# Patient Record
Sex: Male | Born: 1955
Health system: Southern US, Community
[De-identification: ages and names within clinical notes are randomized; demographics above are authoritative.]

## PROBLEM LIST (undated history)

## (undated) DIAGNOSIS — K219 Gastro-esophageal reflux disease without esophagitis: Secondary | ICD-10-CM

## (undated) DIAGNOSIS — I10 Essential (primary) hypertension: Secondary | ICD-10-CM

## (undated) DIAGNOSIS — M545 Low back pain, unspecified: Secondary | ICD-10-CM

## (undated) DIAGNOSIS — T7840XA Allergy, unspecified, initial encounter: Secondary | ICD-10-CM

## (undated) HISTORY — PX: COLONOSCOPY: SHX174

## (undated) HISTORY — DX: Low back pain, unspecified: M54.50

## (undated) HISTORY — DX: Gastro-esophageal reflux disease without esophagitis: K21.9

## (undated) HISTORY — DX: Low back pain: M54.5

## (undated) HISTORY — DX: Allergy, unspecified, initial encounter: T78.40XA

## (undated) HISTORY — DX: Essential (primary) hypertension: I10

---

## 1998-12-07 ENCOUNTER — Other Ambulatory Visit: Admission: RE | Admit: 1998-12-07 | Discharge: 1998-12-07 | Payer: Self-pay | Admitting: Obstetrics and Gynecology

## 2002-06-05 ENCOUNTER — Encounter: Payer: Self-pay | Admitting: Emergency Medicine

## 2002-06-05 ENCOUNTER — Emergency Department (HOSPITAL_COMMUNITY): Admission: EM | Admit: 2002-06-05 | Discharge: 2002-06-05 | Payer: Self-pay | Admitting: Emergency Medicine

## 2003-05-20 ENCOUNTER — Encounter: Payer: Self-pay | Admitting: Emergency Medicine

## 2003-05-20 ENCOUNTER — Emergency Department (HOSPITAL_COMMUNITY): Admission: EM | Admit: 2003-05-20 | Discharge: 2003-05-20 | Payer: Self-pay | Admitting: Emergency Medicine

## 2006-07-07 ENCOUNTER — Ambulatory Visit: Payer: Self-pay | Admitting: Family Medicine

## 2006-12-08 ENCOUNTER — Ambulatory Visit: Payer: Self-pay | Admitting: Family Medicine

## 2007-01-08 ENCOUNTER — Ambulatory Visit: Payer: Self-pay | Admitting: Family Medicine

## 2007-01-08 LAB — CONVERTED CEMR LAB
BUN: 12 mg/dL (ref 6–23)
CO2: 28 meq/L (ref 19–32)
Calcium: 9.2 mg/dL (ref 8.4–10.5)
Chloride: 105 meq/L (ref 96–112)
Cholesterol: 183 mg/dL (ref 0–200)
Creatinine, Ser: 0.9 mg/dL (ref 0.4–1.5)
GFR calc Af Amer: 115 mL/min
GFR calc non Af Amer: 95 mL/min
Glucose, Bld: 95 mg/dL (ref 70–99)
HDL: 49.2 mg/dL (ref >39.0)
LDL Cholesterol: 117 mg/dL — ABNORMAL HIGH (ref 0–99)
Potassium: 3.8 meq/L (ref 3.5–5.1)
Sodium: 141 meq/L (ref 135–145)
Total CHOL/HDL Ratio: 3.7
Triglycerides: 83 mg/dL (ref 0–149)
VLDL: 17 mg/dL (ref 0–40)

## 2007-02-09 ENCOUNTER — Ambulatory Visit: Payer: Self-pay | Admitting: Internal Medicine

## 2007-09-21 ENCOUNTER — Telehealth (INDEPENDENT_AMBULATORY_CARE_PROVIDER_SITE_OTHER): Payer: Self-pay | Admitting: *Deleted

## 2007-09-30 ENCOUNTER — Ambulatory Visit: Payer: Self-pay | Admitting: Family Medicine

## 2007-09-30 DIAGNOSIS — I10 Essential (primary) hypertension: Secondary | ICD-10-CM | POA: Insufficient documentation

## 2007-10-01 ENCOUNTER — Telehealth (INDEPENDENT_AMBULATORY_CARE_PROVIDER_SITE_OTHER): Payer: Self-pay | Admitting: *Deleted

## 2007-10-06 ENCOUNTER — Encounter (INDEPENDENT_AMBULATORY_CARE_PROVIDER_SITE_OTHER): Payer: Self-pay | Admitting: Family Medicine

## 2007-10-28 ENCOUNTER — Ambulatory Visit: Payer: Self-pay | Admitting: Family Medicine

## 2007-10-29 LAB — CONVERTED CEMR LAB
BUN: 9 mg/dL (ref 6–23)
CO2: 29 meq/L (ref 19–32)
Calcium: 9.7 mg/dL (ref 8.4–10.5)
Chloride: 106 meq/L (ref 96–112)
Cholesterol: 191 mg/dL (ref 0–200)
Creatinine, Ser: 1 mg/dL (ref 0.4–1.5)
GFR calc Af Amer: 102 mL/min
GFR calc non Af Amer: 84 mL/min
Glucose, Bld: 89 mg/dL (ref 70–99)
HDL: 48.5 mg/dL (ref >39.0)
LDL Cholesterol: 127 mg/dL — ABNORMAL HIGH (ref 0–99)
PSA: 1.02 ng/mL (ref 0.10–4.00)
Potassium: 3.8 meq/L (ref 3.5–5.1)
Sodium: 141 meq/L (ref 135–145)
Total CHOL/HDL Ratio: 3.9
Triglycerides: 80 mg/dL (ref 0–149)
VLDL: 16 mg/dL (ref 0–40)

## 2007-10-30 ENCOUNTER — Telehealth (INDEPENDENT_AMBULATORY_CARE_PROVIDER_SITE_OTHER): Payer: Self-pay | Admitting: *Deleted

## 2007-10-30 ENCOUNTER — Encounter (INDEPENDENT_AMBULATORY_CARE_PROVIDER_SITE_OTHER): Payer: Self-pay | Admitting: *Deleted

## 2007-12-17 ENCOUNTER — Ambulatory Visit: Payer: Self-pay | Admitting: Family Medicine

## 2008-07-01 ENCOUNTER — Telehealth (INDEPENDENT_AMBULATORY_CARE_PROVIDER_SITE_OTHER): Payer: Self-pay | Admitting: *Deleted

## 2008-07-25 ENCOUNTER — Ambulatory Visit: Payer: Self-pay | Admitting: Internal Medicine

## 2008-07-26 ENCOUNTER — Encounter (INDEPENDENT_AMBULATORY_CARE_PROVIDER_SITE_OTHER): Payer: Self-pay | Admitting: *Deleted

## 2008-08-10 ENCOUNTER — Ambulatory Visit: Payer: Self-pay | Admitting: Gastroenterology

## 2008-08-24 ENCOUNTER — Ambulatory Visit: Payer: Self-pay | Admitting: Gastroenterology

## 2008-08-24 LAB — HM COLONOSCOPY

## 2008-08-26 ENCOUNTER — Telehealth: Payer: Self-pay | Admitting: Gastroenterology

## 2009-02-18 ENCOUNTER — Ambulatory Visit: Payer: Self-pay | Admitting: *Deleted

## 2009-02-18 DIAGNOSIS — J309 Allergic rhinitis, unspecified: Secondary | ICD-10-CM | POA: Insufficient documentation

## 2009-03-15 ENCOUNTER — Ambulatory Visit: Payer: Self-pay | Admitting: Internal Medicine

## 2009-03-21 ENCOUNTER — Ambulatory Visit: Payer: Self-pay | Admitting: Internal Medicine

## 2009-03-27 LAB — CONVERTED CEMR LAB
ALT: 24 units/L (ref 0–53)
AST: 20 units/L (ref 0–37)
BUN: 13 mg/dL (ref 6–23)
Basophils Absolute: 0 10*3/uL (ref 0.0–0.1)
Basophils Relative: 0.1 % (ref 0.0–3.0)
CO2: 30 meq/L (ref 19–32)
Calcium: 9.2 mg/dL (ref 8.4–10.5)
Chloride: 107 meq/L (ref 96–112)
Cholesterol: 184 mg/dL (ref 0–200)
Creatinine, Ser: 0.9 mg/dL (ref 0.4–1.5)
Eosinophils Absolute: 0.1 10*3/uL (ref 0.0–0.7)
Eosinophils Relative: 3.1 % (ref 0.0–5.0)
GFR calc non Af Amer: 94.47 mL/min (ref >60)
Glucose, Bld: 95 mg/dL (ref 70–99)
HCT: 45.8 % (ref 39.0–52.0)
HDL: 40.5 mg/dL (ref >39.00)
Hemoglobin: 15.9 g/dL (ref 13.0–17.0)
LDL Cholesterol: 127 mg/dL — ABNORMAL HIGH (ref 0–99)
Lymphocytes Relative: 32.7 % (ref 12.0–46.0)
Lymphs Abs: 1.5 10*3/uL (ref 0.7–4.0)
MCHC: 34.7 g/dL (ref 30.0–36.0)
MCV: 90.9 fL (ref 78.0–100.0)
Monocytes Absolute: 0.4 10*3/uL (ref 0.1–1.0)
Monocytes Relative: 8.2 % (ref 3.0–12.0)
Neutro Abs: 2.6 10*3/uL (ref 1.4–7.7)
Neutrophils Relative %: 55.9 % (ref 43.0–77.0)
PSA: 1.11 ng/mL (ref 0.10–4.00)
Platelets: 180 10*3/uL (ref 150.0–400.0)
Potassium: 4.2 meq/L (ref 3.5–5.1)
RBC: 5.03 M/uL (ref 4.22–5.81)
RDW: 11.6 % (ref 11.5–14.6)
Sodium: 142 meq/L (ref 135–145)
TSH: 1.33 microintl units/mL (ref 0.35–5.50)
Total CHOL/HDL Ratio: 5
Triglycerides: 84 mg/dL (ref 0.0–149.0)
VLDL: 16.8 mg/dL (ref 0.0–40.0)
WBC: 4.6 10*3/uL (ref 4.5–10.5)

## 2010-03-12 ENCOUNTER — Encounter (INDEPENDENT_AMBULATORY_CARE_PROVIDER_SITE_OTHER): Payer: Self-pay | Admitting: *Deleted

## 2010-03-21 ENCOUNTER — Ambulatory Visit: Payer: Self-pay | Admitting: Internal Medicine

## 2010-03-27 LAB — CONVERTED CEMR LAB
ALT: 22 units/L (ref 0–53)
AST: 19 units/L (ref 0–37)
BUN: 15 mg/dL (ref 6–23)
Basophils Absolute: 0 10*3/uL (ref 0.0–0.1)
Basophils Relative: 0.4 % (ref 0.0–3.0)
CO2: 31 meq/L (ref 19–32)
Calcium: 9.4 mg/dL (ref 8.4–10.5)
Chloride: 103 meq/L (ref 96–112)
Cholesterol: 195 mg/dL (ref 0–200)
Creatinine, Ser: 1 mg/dL (ref 0.4–1.5)
Eosinophils Absolute: 0.1 10*3/uL (ref 0.0–0.7)
Eosinophils Relative: 2.3 % (ref 0.0–5.0)
GFR calc non Af Amer: 83.32 mL/min (ref >60)
Glucose, Bld: 102 mg/dL — ABNORMAL HIGH (ref 70–99)
HCT: 46.2 % (ref 39.0–52.0)
HDL: 50.8 mg/dL (ref >39.00)
Hemoglobin: 16 g/dL (ref 13.0–17.0)
LDL Cholesterol: 131 mg/dL — ABNORMAL HIGH (ref 0–99)
Lymphocytes Relative: 28.7 % (ref 12.0–46.0)
Lymphs Abs: 1.5 10*3/uL (ref 0.7–4.0)
MCHC: 34.7 g/dL (ref 30.0–36.0)
MCV: 89.7 fL (ref 78.0–100.0)
Monocytes Absolute: 0.3 10*3/uL (ref 0.1–1.0)
Monocytes Relative: 6.3 % (ref 3.0–12.0)
Neutro Abs: 3.2 10*3/uL (ref 1.4–7.7)
Neutrophils Relative %: 62.3 % (ref 43.0–77.0)
PSA: 1.17 ng/mL (ref 0.10–4.00)
Platelets: 208 10*3/uL (ref 150.0–400.0)
Potassium: 4.2 meq/L (ref 3.5–5.1)
RBC: 5.15 M/uL (ref 4.22–5.81)
RDW: 12.7 % (ref 11.5–14.6)
Sodium: 141 meq/L (ref 135–145)
TSH: 2.55 microintl units/mL (ref 0.35–5.50)
Total CHOL/HDL Ratio: 4
Triglycerides: 64 mg/dL (ref 0.0–149.0)
VLDL: 12.8 mg/dL (ref 0.0–40.0)
WBC: 5.1 10*3/uL (ref 4.5–10.5)

## 2010-12-03 ENCOUNTER — Emergency Department (HOSPITAL_COMMUNITY)
Admission: EM | Admit: 2010-12-03 | Discharge: 2010-12-03 | Payer: Self-pay | Source: Home / Self Care | Admitting: Emergency Medicine

## 2010-12-05 ENCOUNTER — Ambulatory Visit
Admission: RE | Admit: 2010-12-05 | Discharge: 2010-12-05 | Payer: Self-pay | Source: Home / Self Care | Attending: Internal Medicine | Admitting: Internal Medicine

## 2010-12-05 DIAGNOSIS — R1013 Epigastric pain: Secondary | ICD-10-CM | POA: Insufficient documentation

## 2010-12-10 LAB — COMPREHENSIVE METABOLIC PANEL
Albumin: 4.2 g/dL (ref 3.5–5.2)
BUN: 12 mg/dL (ref 6–23)
CO2: 27 mEq/L (ref 19–32)
Calcium: 9 mg/dL (ref 8.4–10.5)
Chloride: 106 mEq/L (ref 96–112)
Creatinine, Ser: 1.02 mg/dL (ref 0.4–1.5)
GFR calc Af Amer: >60 mL/min (ref >60)
GFR calc non Af Amer: >60 mL/min (ref >60)
Glucose, Bld: 121 mg/dL — ABNORMAL HIGH (ref 70–99)
Potassium: 3.8 mEq/L (ref 3.5–5.1)
Sodium: 140 mEq/L (ref 135–145)
Total Bilirubin: 0.9 mg/dL (ref 0.3–1.2)

## 2010-12-10 LAB — DIFFERENTIAL
Basophils Absolute: 0 10*3/uL (ref 0.0–0.1)
Basophils Relative: 0 % (ref 0–1)
Eosinophils Absolute: 0.1 10*3/uL (ref 0.0–0.7)
Eosinophils Relative: 1 % (ref 0–5)
Lymphs Abs: 0.9 10*3/uL (ref 0.7–4.0)
Monocytes Absolute: 0.3 10*3/uL (ref 0.1–1.0)
Monocytes Relative: 7 % (ref 3–12)
Neutro Abs: 3 10*3/uL (ref 1.7–7.7)
Neutrophils Relative %: 70 % (ref 43–77)

## 2010-12-10 LAB — CBC
HCT: 44.2 % (ref 39.0–52.0)
Hemoglobin: 14.8 g/dL (ref 13.0–17.0)
MCH: 29.4 pg (ref 26.0–34.0)
MCHC: 33.5 g/dL (ref 30.0–36.0)
MCV: 87.9 fL (ref 78.0–100.0)
Platelets: 175 10*3/uL (ref 150–400)
RBC: 5.03 MIL/uL (ref 4.22–5.81)
RDW: 12.5 % (ref 11.5–15.5)
WBC: 4.3 10*3/uL (ref 4.0–10.5)

## 2010-12-10 LAB — LIPASE, BLOOD: Lipase: 29 U/L (ref 11–59)

## 2010-12-10 LAB — POCT CARDIAC MARKERS
CKMB, poc: <1 ng/mL — ABNORMAL LOW (ref 1.0–8.0)
Myoglobin, poc: 71 ng/mL (ref 12–200)
Troponin i, poc: <0.05 ng/mL (ref 0.00–0.09)

## 2010-12-25 NOTE — Assessment & Plan Note (Signed)
Summary: cpx/kdc   Vital Signs:  Patient profile:   55 year old male Height:      71 inches Weight:      210.6 pounds BMI:     29.48 Pulse rate:   68 / minute BP sitting:   130 / 84  Vitals Entered By: Shary Decamp (March 21, 2010 9:31 AM) CC: cpx - fasting Is Patient Diabetic? No   History of Present Illness: CPX doing well  Preventive Screening-Counseling & Management  Alcohol-Tobacco     Alcohol type: wine  Caffeine-Diet-Exercise     Caffeine use/day: 2     Times/week: 2  Current Medications (verified): 1)  Norvasc 10 Mg  Tabs (Amlodipine Besylate) .... Take One Tablet Daily - Needs Office Visit For Additional Refills 2)  Flonase 50 Mcg/act  Susp (Fluticasone Propionate) .... 2 Sprays Each Nostril Qhs  Allergies (verified): No Known Drug Allergies  Past History:  Past Medical History: Reviewed history from 02/18/2009 and no changes required. Hypertension Allergic rhinitis intermittent low back pain  Past Surgical History: Reviewed history from 07/25/2008 and no changes required. no  Family History: Reviewed history from 07/25/2008 and no changes required. 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 CV:  Denies chest pain or discomfort; occasionally LE edema . Resp:  Denies cough and shortness of breath. GI:  Denies bloody stools, diarrhea, nausea, and vomiting. GU:  Denies hematuria, urinary frequency, and urinary hesitancy.  Physical Exam  General:  alert, well-developed, and well-nourished.   Neck:  no masses and no thyromegaly.   Lungs:  normal respiratory effort, no intercostal retractions, no accessory muscle use, and normal breath sounds.   Heart:  normal rate, regular rhythm, and no murmur.     Abdomen:  soft, non-tender, no distention, no masses, no guarding, and no rigidity.  no bruit Rectal:  small external hemorrhoids noted. Normal sphincter tone. No rectal masses or tenderness. Prostate:  Prostate gland firm and smooth, no enlargement, nodularity, tenderness, mass, asymmetry or induration. Extremities:  trace bilateral lower extremity edema Psych:  Cognition and judgment appear intact. Alert and cooperative with normal attention span and concentration. not anxious appearing and not depressed appearing.     Impression & Recommendations:  Problem # 1:  PREVENTIVE HEALTH CARE (ICD-V70.0)  Td 12- 2008 Cscope 9-09 neg except for hemorhhoids   doing very well encouraged to cont. his healthy lifestyle     Orders: Venipuncture (13086) TLB-BMP (Basic Metabolic Panel-BMET) (80048-METABOL) TLB-CBC Platelet - w/Differential (85025-CBCD) TLB-ALT (SGPT) (84460-ALT) TLB-AST (SGOT) (84450-SGOT) TLB-Lipid Panel (80061-LIPID) TLB-PSA (Prostate Specific Antigen) (84153-PSA) TLB-TSH (Thyroid Stimulating Hormone) (84443-TSH)  Problem # 2:  HYPERTENSION (ICD-401.9) BP well controlled, trace bilateral lower shunt edema likely from Norvasc.  Not a major problem for now.  Advised patient to let me know if this  gets worse His updated medication list for this problem includes:    Norvasc 10 Mg Tabs (Amlodipine besylate) .Marland Kitchen... Take one tablet daily  BP today: 130/84 Prior BP: 150/90 (03/15/2009)  Labs Reviewed: K+: 4.2 (03/21/2009) Creat: : 0.9 (03/21/2009)   Chol: 184 (03/21/2009)   HDL: 40.50 (03/21/2009)   LDL: 127 (03/21/2009)   TG: 84.0 (03/21/2009)  Complete Medication List: 1)  Norvasc 10 Mg Tabs (Amlodipine besylate) .... Take one tablet daily 2)  Flonase 50  Mcg/act Susp (Fluticasone propionate) .... 2 sprays each nostril qhs  Patient Instructions: 1)  Check your blood pressure 2   times a month. If it is more than 140/85 consistently,please let us know  2)  Please  schedule a follow-up appointment in 1 year.  Prescriptions: NORVASC 10 MG  TABS (AMLODIPINE BESYLATE) Take one tablet daily - NEEDS OFFICE VISIT FOR ADDITIONAL REFILLS  #30 x 6   Entered by:   Shary Decamp   Authorized by:   Nolon Rod. Keirra Zeimet MD   Signed by:   Shary Decamp on 03/21/2010   Method used:   Electronically to        [facility] [id number] (retail)       [address]       [place], [place]  [address]       Ph: [phone]       Fax: [phone]   RxID:   [id number]      Preventive Care Screening  Prior Values:    PSA:  1.11 (03/21/2009)    Colonoscopy:  Location:  [facility].   (08/24/2008)    Last Tetanus Booster:  Tdap (10/28/2007)     Risk Factors:  Tobacco use:  never Caffeine use:  2 drinks per day Alcohol use:  yes    Type:  wine    Drinks per day:  1 Exercise:  yes    Times per week:  2    Type:  martial arts-bike   Colonoscopy History:    Date of Last Colonoscopy:  08/24/2008

## 2010-12-25 NOTE — Letter (Signed)
Summary: Primary Care Appointment Letter  [facility]  [address] [place], [place] [address]   Phone: [phone]  Fax: [phone]    03/12/2010 MRN: [id number]  OKLEY MAGNUSSEN [address] [place], [place]  [address]  Dear Mr. Hellinger,   Your Primary Care Physician Centerton E. Paz MD has indicated that:    ____X___it is time to schedule an appointment.  Please call our office @ [phone] to schedule an office visit with Dr. Drue Novel.   Thank you,    Deaf Smith Primary Care Scheduler

## 2010-12-27 NOTE — Assessment & Plan Note (Signed)
Summary: ER f/u on dx of GERD//lch   Vital Signs:  Patient profile:   55 year old male Weight:      215.13 pounds Pulse rate:   80 / minute Pulse rhythm:   regular BP sitting:   138 / 90  (left arm) Cuff size:   large  Vitals Entered By: Army Fossa CMA (December 05, 2010 2:56 PM)  History of Present Illness: the patient woke up with an acute, sharp, epigastric pain. Pain was moderate to severe No radiation Went to the ER, symptoms responded quickly to a GI cocktail. His upper abdomen was slightly distended.  ER records from 1- 9- 12 abdominal x-rays and EKG normal BMP, CBC, LFTs, lipase normal Cardiac enzymes x1 normal  ROS Since the ER visit, he is doing better, he is taking Carafate and Protonix. Abdominal pain essentially has subsided. Denies nausea, vomiting, diarrhea No classic heartburn or odynophagia No substernal chest pain Bowel movements are regular (slightly more frequent lately)  Current Medications (verified): 1)  Norvasc 10 Mg  Tabs (Amlodipine Besylate) .... Take One Tablet Daily 2)  Flonase 50 Mcg/act  Susp (Fluticasone Propionate) .... 2 Sprays Each Nostril Qhs 3)  Carafate 1 Gm Tabs (Sucralfate) .Marland Kitchen.. 1 By Mouth Qid Ac and At Bedtime. 4)  Protonix 40 Mg Solr (Pantoprazole Sodium) .Marland Kitchen.. 1 By Mouth Daily.  Allergies (verified): No Known Drug Allergies  Past History:  Past Medical History: Reviewed history from 02/18/2009 and no changes required. Hypertension Allergic rhinitis intermittent low back pain  Past Surgical History: Reviewed history from 07/25/2008 and no changes required. no  Social History: Reviewed history from 03/21/2010 and no changes required. 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   Lungs:  normal respiratory effort, no intercostal retractions, no  accessory muscle use, and normal breath sounds.   Heart:  normal rate, regular rhythm, and no murmur.   Abdomen:  soft, non-tender, no distention, no masses, no guarding, and no rigidity.     Impression & Recommendations:  Problem # 1:  EPIGASTRIC PAIN (ICD-789.06) acute epigastric pain, ER workup negative. pain is getting better with Protonix and Carafate. DDX includes gastritis, PUD,  GERD, Gallbladder  Plan: Discontinue Carafate Continue Protonix for 6 weeks Further evaluation if the pain resurface  Complete Medication List: 1)  Norvasc 10 Mg Tabs (Amlodipine besylate) .... Take one tablet daily 2)  Flonase 50 Mcg/act Susp (Fluticasone propionate) .... 2 sprays each nostril qhs 3)  Protonix 40 Mg Solr (Pantoprazole sodium) .Marland Kitchen.. 1 by mouth daily.  Patient Instructions: 1)  stop carafate 2)   protonix  1 a day x 6 weeks, then stop 3)  call id symptoms severe or resurface Prescriptions: PROTONIX 40 MG SOLR (PANTOPRAZOLE SODIUM) 1 by mouth daily.  #30 x 1   Entered and Authorized by:   Nolon Rod. Paz MD   Signed by:   Nolon Rod. Paz MD on 12/05/2010   Method used:   Print then Give to Patient   RxID:   [id number]    Orders Added: 1)  Est. Patient Level III [14782]

## 2011-01-17 ENCOUNTER — Other Ambulatory Visit: Payer: Self-pay | Admitting: Internal Medicine

## 2011-01-17 ENCOUNTER — Encounter (INDEPENDENT_AMBULATORY_CARE_PROVIDER_SITE_OTHER): Payer: 59 | Admitting: Internal Medicine

## 2011-01-17 ENCOUNTER — Encounter: Payer: Self-pay | Admitting: Internal Medicine

## 2011-01-17 DIAGNOSIS — Z0000 Encounter for general adult medical examination without abnormal findings: Secondary | ICD-10-CM

## 2011-01-17 LAB — CBC WITH DIFFERENTIAL/PLATELET
Basophils Absolute: 0 10*3/uL (ref 0.0–0.1)
Basophils Relative: 0.4 % (ref 0.0–3.0)
Eosinophils Absolute: 0.1 10*3/uL (ref 0.0–0.7)
Eosinophils Relative: 1.8 % (ref 0.0–5.0)
HCT: 45.7 % (ref 39.0–52.0)
Hemoglobin: 15.7 g/dL (ref 13.0–17.0)
Lymphocytes Relative: 30.2 % (ref 12.0–46.0)
Lymphs Abs: 1.5 10*3/uL (ref 0.7–4.0)
MCHC: 34.4 g/dL (ref 30.0–36.0)
MCV: 91.3 fl (ref 78.0–100.0)
Monocytes Absolute: 0.4 10*3/uL (ref 0.1–1.0)
Monocytes Relative: 7.7 % (ref 3.0–12.0)
Neutro Abs: 2.9 10*3/uL (ref 1.4–7.7)
Neutrophils Relative %: 59.9 % (ref 43.0–77.0)
Platelets: 204 10*3/uL (ref 150.0–400.0)
RBC: 5 Mil/uL (ref 4.22–5.81)
RDW: 12.8 % (ref 11.5–14.6)
WBC: 4.9 10*3/uL (ref 4.5–10.5)

## 2011-01-17 LAB — BASIC METABOLIC PANEL
BUN: 15 mg/dL (ref 6–23)
CO2: 28 mEq/L (ref 19–32)
Calcium: 9.3 mg/dL (ref 8.4–10.5)
Chloride: 108 mEq/L (ref 96–112)
Creatinine, Ser: 0.9 mg/dL (ref 0.4–1.5)
GFR: 89.21 mL/min (ref >60.00)
Glucose, Bld: 100 mg/dL — ABNORMAL HIGH (ref 70–99)
Potassium: 4.1 mEq/L (ref 3.5–5.1)
Sodium: 140 mEq/L (ref 135–145)

## 2011-01-17 LAB — LIPID PANEL
Cholesterol: 189 mg/dL (ref 0–200)
HDL: 41.3 mg/dL (ref >39.00)
LDL Cholesterol: 129 mg/dL — ABNORMAL HIGH (ref 0–99)
Total CHOL/HDL Ratio: 5
Triglycerides: 93 mg/dL (ref 0.0–149.0)
VLDL: 18.6 mg/dL (ref 0.0–40.0)

## 2011-01-17 LAB — HEPATIC FUNCTION PANEL
ALT: 20 U/L (ref 0–53)
AST: 19 U/L (ref 0–37)
Albumin: 4.4 g/dL (ref 3.5–5.2)
Alkaline Phosphatase: 56 U/L (ref 39–117)
Bilirubin, Direct: 0.2 mg/dL (ref 0.0–0.3)
Total Bilirubin: 1.5 mg/dL — ABNORMAL HIGH (ref 0.3–1.2)
Total Protein: 7.2 g/dL (ref 6.0–8.3)

## 2011-01-17 LAB — PSA: PSA: 1.01 ng/mL (ref 0.10–4.00)

## 2011-01-22 NOTE — Assessment & Plan Note (Signed)
Summary: CPX AND FASTING LABS/SPH/PH   Vital Signs:  Patient profile:   55 year old male Height:      71 inches Weight:      212.25 pounds BMI:     29.71 Pulse rate:   88 / minute Pulse rhythm:   regular BP sitting:   142 / 90  (left arm) Cuff size:   large  Vitals Entered By: Army Fossa CMA (January 17, 2011 7:58 AM) CC: CPX, fasting Comments W/ pts BP machine- 162/96 [facility]   History of Present Illness: CPX ambulatory BPs 120-140s/80s, his machine does not seem  to be calibrate  Preventive Screening-Counseling & Management  Caffeine-Diet-Exercise     Type of exercise: walking 1 1/2 miles      Times/week: 7  Current Medications (verified): 1)  Norvasc 10 Mg  Tabs (Amlodipine Besylate) .... Take One Tablet Daily 2)  Flonase 50 Mcg/act  Susp (Fluticasone Propionate) .... 2 Sprays Each Nostril Qhs 3)  Protonix 40 Mg Solr (Pantoprazole Sodium) .Marland Kitchen.. 1 By Mouth Daily.  Allergies (verified): No Known Drug Allergies  Past History:  Past Medical History: Reviewed history from 02/18/2009 and no changes required. Hypertension Allergic rhinitis intermittent low back pain  Past Surgical History: Reviewed history from 07/25/2008 and no changes required. 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 CV:  Denies chest pain or discomfort and swelling of feet. Resp:  Denies cough and shortness of breath. GI:  Denies bloody stools and nausea; no GERD, symptoms well controlled w/ PPIs. GU:  Denies hematuria, urinary frequency, and urinary hesitancy.  Physical Exam  General:  alert, well-developed, and well-nourished.   Neck:  no  masses and no thyromegaly.   Lungs:  normal respiratory effort, no intercostal retractions, no accessory muscle use, and normal breath sounds.   Heart:  normal rate, regular rhythm, and no murmur.   Abdomen:  soft, non-tender, no distention, no masses, no guarding, and no rigidity.   Rectal:  small external hemorrhoids noted. Normal sphincter tone. No rectal masses or tenderness. Prostate:  Prostate gland firm and smooth, no enlargement, nodularity, tenderness, mass, asymmetry or induration. Extremities:  no pretibial edema bilaterally  Psych:  Cognition and judgment appear intact. Alert and cooperative with normal attention span and concentration. No apparent delusions, illusions, hallucinations   Impression & Recommendations:  Problem # 1:  PREVENTIVE HEALTH CARE (ICD-V70.0) Td 12- 2008  Cscope 9-09 neg except for hemorhhoids   encouraged to cont. his healthy lifestyle  pt  will need a copy of his  labs  Orders: Venipuncture (19147) TLB-BMP (Basic Metabolic Panel-BMET) (80048-METABOL) TLB-CBC Platelet - w/Differential (85025-CBCD) TLB-Lipid Panel (80061-LIPID) TLB-Hepatic/Liver Function Pnl (80076-HEPATIC) TLB-PSA (Prostate Specific Antigen) (84153-PSA) Specimen Handling (82956)  Problem # 2:  HYPERTENSION (ICD-401.9) BP at goal? will ask patient to get a calibrated machine, see instructions  His updated medication list for this problem includes:    Norvasc 10 Mg Tabs (Amlodipine besylate) .Marland Kitchen... Take one tablet daily  BP today: 142/90 Prior BP: 138/90 (12/05/2010)  Labs Reviewed: K+: 4.2 (03/21/2010) Creat: : 1.0 (03/21/2010)   Chol: 195 (03/21/2010)   HDL: 50.80 (03/21/2010)   LDL: 131 (03/21/2010)   TG: 64.0 (  03/21/2010)  Complete Medication List: 1)  Norvasc 10 Mg Tabs (Amlodipine besylate) .... Take one tablet daily 2)  Flonase 50 Mcg/act Susp (Fluticasone propionate) .... 2 sprays each nostril qhs 3)  Protonix 40 Mg Solr (Pantoprazole sodium) .Marland Kitchen.. 1 by mouth  daily.  Patient Instructions: 1)  Check your blood pressure 2 or 3 times a week. If it is more than 140/85 consistently,please let us know  2)  Please schedule a follow-up appointment in 6 months .    Orders Added: 1)  Venipuncture [36415] 2)  TLB-BMP (Basic Metabolic Panel-BMET) [80048-METABOL] 3)  TLB-CBC Platelet - w/Differential [85025-CBCD] 4)  TLB-Lipid Panel [80061-LIPID] 5)  TLB-Hepatic/Liver Function Pnl [80076-HEPATIC] 6)  TLB-PSA (Prostate Specific Antigen) [84153-PSA] 7)  Specimen Handling [99000] 8)  Est. Patient age 41-64 [89]     Risk Factors:  Exercise:  yes    Times per week:  7    Type:  walking 1 1/2 miles

## 2011-02-21 ENCOUNTER — Other Ambulatory Visit: Payer: Self-pay | Admitting: Internal Medicine

## 2011-04-12 ENCOUNTER — Other Ambulatory Visit: Payer: Self-pay | Admitting: Internal Medicine

## 2011-04-13 ENCOUNTER — Other Ambulatory Visit: Payer: Self-pay | Admitting: Internal Medicine

## 2011-05-13 ENCOUNTER — Ambulatory Visit (INDEPENDENT_AMBULATORY_CARE_PROVIDER_SITE_OTHER): Payer: 59 | Admitting: Internal Medicine

## 2011-05-13 ENCOUNTER — Encounter: Payer: Self-pay | Admitting: Internal Medicine

## 2011-05-13 VITALS — BP 148/82 | HR 90 | Wt 212.4 lb

## 2011-05-13 DIAGNOSIS — I10 Essential (primary) hypertension: Secondary | ICD-10-CM

## 2011-05-13 DIAGNOSIS — R739 Hyperglycemia, unspecified: Secondary | ICD-10-CM | POA: Insufficient documentation

## 2011-05-13 DIAGNOSIS — R7309 Other abnormal glucose: Secondary | ICD-10-CM

## 2011-05-13 NOTE — Assessment & Plan Note (Signed)
BP well controlled most of the time See instructions

## 2011-05-13 NOTE — Patient Instructions (Signed)
Check the  blood pressure 2 or 3 times a week, be sure it is less than 140/85. If it is consistently higher, let me know  

## 2011-05-13 NOTE — Progress Notes (Signed)
  Subjective:    Patient ID: Jacob Gould, male    DOB: Dec 01, 1955, 55 y.o.   MRN: [id number]  HPI Here for BP check. Ambulatory blood pressures are mostly within normal, usually 120-130/70-80, from time to time it goes higher to > 140 is usually late in the afternoon (patient takes amlodipine early in the morning at 5 AM)  Past Medical History  Diagnosis Date  . Hypertension   . Allergic rhinitis   . Low back pain     intermittent   No past surgical history on file.   Review of Systems Feeling well, no chest pain, shortness of breath or lower extremity edema. Diet is healthy. He continued to be very active, walks his dog, exercise frequently and ride his bike.    Objective:   Physical Exam  Constitutional: He is oriented to person, place, and time. He appears well-developed.  Cardiovascular: Normal rate, regular rhythm and normal heart sounds.   No murmur heard. Pulmonary/Chest: Effort normal and breath sounds normal. No respiratory distress. He has no wheezes. He has no rales.  Musculoskeletal: He exhibits no edema.  Neurological: He is alert and oriented to person, place, and time.  Psychiatric: He has a normal mood and affect. His behavior is normal. Judgment and thought content normal.          Assessment & Plan:

## 2011-05-13 NOTE — Assessment & Plan Note (Addendum)
Mildly elevated CBG, will check a A1C, discussed with the patient the meaning of A1c

## 2011-05-14 LAB — HEMOGLOBIN A1C: Hgb A1c MFr Bld: 5.7 % (ref 4.6–6.5)

## 2011-07-11 ENCOUNTER — Other Ambulatory Visit: Payer: Self-pay | Admitting: Internal Medicine

## 2011-07-11 NOTE — Telephone Encounter (Signed)
Rx Done . 

## 2011-07-13 ENCOUNTER — Other Ambulatory Visit: Payer: Self-pay | Admitting: Internal Medicine

## 2011-07-15 NOTE — Telephone Encounter (Signed)
Rx Done . 

## 2011-07-18 ENCOUNTER — Ambulatory Visit: Payer: 59 | Admitting: Internal Medicine

## 2011-09-09 ENCOUNTER — Other Ambulatory Visit: Payer: Self-pay | Admitting: Internal Medicine

## 2011-09-09 NOTE — Telephone Encounter (Signed)
Done

## 2011-09-13 ENCOUNTER — Other Ambulatory Visit: Payer: Self-pay | Admitting: Internal Medicine

## 2011-09-13 NOTE — Telephone Encounter (Signed)
Done

## 2011-11-08 ENCOUNTER — Other Ambulatory Visit: Payer: Self-pay | Admitting: Internal Medicine

## 2011-12-07 ENCOUNTER — Other Ambulatory Visit: Payer: Self-pay | Admitting: Internal Medicine

## 2012-01-07 ENCOUNTER — Other Ambulatory Visit: Payer: Self-pay | Admitting: Internal Medicine

## 2012-01-07 NOTE — Telephone Encounter (Signed)
Refill done.  

## 2012-01-21 ENCOUNTER — Ambulatory Visit (INDEPENDENT_AMBULATORY_CARE_PROVIDER_SITE_OTHER): Payer: 59 | Admitting: Internal Medicine

## 2012-01-21 ENCOUNTER — Encounter: Payer: Self-pay | Admitting: Internal Medicine

## 2012-01-21 DIAGNOSIS — R739 Hyperglycemia, unspecified: Secondary | ICD-10-CM

## 2012-01-21 DIAGNOSIS — I10 Essential (primary) hypertension: Secondary | ICD-10-CM

## 2012-01-21 DIAGNOSIS — Z0000 Encounter for general adult medical examination without abnormal findings: Secondary | ICD-10-CM | POA: Insufficient documentation

## 2012-01-21 MED ORDER — AMLODIPINE BESYLATE 10 MG PO TABS: 10.0000 mg | ORAL_TABLET | Freq: Every day | ORAL | Status: DC

## 2012-01-21 NOTE — Progress Notes (Signed)
  Subjective:    Patient ID: Jacob Gould, male    DOB: Apr 20, 1956, 56 y.o.   MRN: [id number]  HPI CPX Patient is doing great, his diet has significantly improved, doing low salt, low fat. Has no more lower extremity edema. Ambulatory BPs are checked frequently and they are consistently less than 120/70. Good medication compliance.  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  Respiratory: Negative for cough and shortness of breath.   Cardiovascular: Negative for chest pain and leg swelling.  Gastrointestinal: Negative for abdominal pain and blood in stool.  Genitourinary: Negative for dysuria and hematuria.       Objective:   Physical Exam  General:  alert, well-developed, and well-nourished.   Neck:  no masses and no thyromegaly.   Lungs:  normal respiratory effort, no intercostal retractions, no accessory muscle use, and normal breath sounds.   Heart:  normal rate, regular rhythm, and no murmur.   Abdomen:  soft, non-tender, no distention, no masses, no guarding, and no rigidity. No bruit  Rectal:  small external hemorrhoids noted. Normal sphincter tone. No rectal masses or tenderness. Prostate:  Prostate gland firm and smooth, no enlargement, nodularity, tenderness, mass, asymmetry or induration. Extremities:  no pretibial edema bilaterally  Psych:  Cognition and judgment appear intact. Alert and cooperative with normal attention span and concentration. No apparent delusions, illusions, hallucinations     Assessment & Plan:

## 2012-01-21 NOTE — Assessment & Plan Note (Signed)
aic 5.7 04-2011

## 2012-01-21 NOTE — Assessment & Plan Note (Signed)
Great amb BPs, no edema on CCBs No change

## 2012-01-21 NOTE — Patient Instructions (Signed)
Came back fasting for labs only: FLP CMP CBC TSH PSA--- dx V70 Next visit one year as lomg as you feel well and your BP is < 135/85

## 2012-01-21 NOTE — Assessment & Plan Note (Signed)
Td 12- 2008 Cscope 9-09 neg except for hemorhhoids  encouraged to cont. his healthy lifestyle pt  will need a copy of his  labs

## 2012-01-24 ENCOUNTER — Other Ambulatory Visit (INDEPENDENT_AMBULATORY_CARE_PROVIDER_SITE_OTHER): Payer: 59

## 2012-01-24 DIAGNOSIS — Z0000 Encounter for general adult medical examination without abnormal findings: Secondary | ICD-10-CM

## 2012-01-24 LAB — CBC
HCT: 46 % (ref 39.0–52.0)
Hemoglobin: 15.6 g/dL (ref 13.0–17.0)
MCHC: 33.9 g/dL (ref 30.0–36.0)
MCV: 90.8 fl (ref 78.0–100.0)
Platelets: 190 10*3/uL (ref 150.0–400.0)
RBC: 5.07 Mil/uL (ref 4.22–5.81)
RDW: 12.7 % (ref 11.5–14.6)
WBC: 4.8 10*3/uL (ref 4.5–10.5)

## 2012-01-24 LAB — LIPID PANEL
HDL: 42.3 mg/dL (ref >39.00)
Total CHOL/HDL Ratio: 5
Triglycerides: 85 mg/dL (ref 0.0–149.0)
VLDL: 17 mg/dL (ref 0.0–40.0)

## 2012-01-24 LAB — COMPREHENSIVE METABOLIC PANEL
ALT: 20 U/L (ref 0–53)
AST: 20 U/L (ref 0–37)
Albumin: 4.2 g/dL (ref 3.5–5.2)
Alkaline Phosphatase: 59 U/L (ref 39–117)
BUN: 14 mg/dL (ref 6–23)
CO2: 25 mEq/L (ref 19–32)
Calcium: 9.2 mg/dL (ref 8.4–10.5)
Chloride: 107 mEq/L (ref 96–112)
Creatinine, Ser: 1 mg/dL (ref 0.4–1.5)
GFR: 82.74 mL/min (ref >60.00)
Glucose, Bld: 95 mg/dL (ref 70–99)
Potassium: 4.3 mEq/L (ref 3.5–5.1)
Sodium: 139 mEq/L (ref 135–145)
Total Bilirubin: 1.5 mg/dL — ABNORMAL HIGH (ref 0.3–1.2)
Total Protein: 7 g/dL (ref 6.0–8.3)

## 2012-01-24 LAB — TSH: TSH: 1.26 u[IU]/mL (ref 0.35–5.50)

## 2012-01-24 LAB — PSA: PSA: 1.05 ng/mL (ref 0.10–4.00)

## 2012-01-29 ENCOUNTER — Encounter: Payer: Self-pay | Admitting: *Deleted

## 2012-02-05 ENCOUNTER — Other Ambulatory Visit: Payer: Self-pay | Admitting: Internal Medicine

## 2012-02-06 NOTE — Telephone Encounter (Signed)
Refill done.  

## 2012-02-28 ENCOUNTER — Other Ambulatory Visit: Payer: Self-pay | Admitting: Internal Medicine

## 2012-02-28 NOTE — Telephone Encounter (Signed)
Refill done.  

## 2012-03-07 ENCOUNTER — Other Ambulatory Visit: Payer: Self-pay | Admitting: Internal Medicine

## 2012-04-06 ENCOUNTER — Other Ambulatory Visit: Payer: Self-pay | Admitting: Internal Medicine

## 2012-04-06 NOTE — Telephone Encounter (Signed)
Refill done.  

## 2012-06-05 ENCOUNTER — Other Ambulatory Visit: Payer: Self-pay | Admitting: Internal Medicine

## 2012-06-05 NOTE — Telephone Encounter (Signed)
Refill done.  

## 2012-09-25 ENCOUNTER — Other Ambulatory Visit: Payer: Self-pay | Admitting: Internal Medicine

## 2012-09-25 NOTE — Telephone Encounter (Signed)
Refill done.  

## 2012-10-02 ENCOUNTER — Other Ambulatory Visit: Payer: Self-pay | Admitting: Internal Medicine

## 2012-10-02 NOTE — Telephone Encounter (Signed)
Refill done.  

## 2013-02-20 ENCOUNTER — Other Ambulatory Visit: Payer: Self-pay | Admitting: Internal Medicine

## 2013-02-22 NOTE — Telephone Encounter (Signed)
Pt has future appt 4.4.14 Refill done.

## 2013-02-26 ENCOUNTER — Ambulatory Visit (INDEPENDENT_AMBULATORY_CARE_PROVIDER_SITE_OTHER): Payer: 59 | Admitting: Internal Medicine

## 2013-02-26 ENCOUNTER — Encounter: Payer: Self-pay | Admitting: Internal Medicine

## 2013-02-26 VITALS — BP 134/84 | HR 73 | Temp 98.0°F | Ht 71.0 in | Wt 203.0 lb

## 2013-02-26 DIAGNOSIS — R739 Hyperglycemia, unspecified: Secondary | ICD-10-CM

## 2013-02-26 DIAGNOSIS — R7309 Other abnormal glucose: Secondary | ICD-10-CM

## 2013-02-26 DIAGNOSIS — I10 Essential (primary) hypertension: Secondary | ICD-10-CM

## 2013-02-26 DIAGNOSIS — Z0000 Encounter for general adult medical examination without abnormal findings: Secondary | ICD-10-CM

## 2013-02-26 LAB — PSA: PSA: 1.59 ng/mL (ref 0.10–4.00)

## 2013-02-26 LAB — COMPREHENSIVE METABOLIC PANEL
ALT: 21 U/L (ref 0–53)
Alkaline Phosphatase: 54 U/L (ref 39–117)
CO2: 26 mEq/L (ref 19–32)
Creatinine, Ser: 1 mg/dL (ref 0.4–1.5)
GFR: 84.35 mL/min (ref >60.00)
Glucose, Bld: 85 mg/dL (ref 70–99)
Sodium: 137 mEq/L (ref 135–145)
Total Bilirubin: 1.8 mg/dL — ABNORMAL HIGH (ref 0.3–1.2)
Total Protein: 7.3 g/dL (ref 6.0–8.3)

## 2013-02-26 LAB — LIPID PANEL
Cholesterol: 173 mg/dL (ref 0–200)
HDL: 44.3 mg/dL (ref >39.00)
Total CHOL/HDL Ratio: 4
Triglycerides: 66 mg/dL (ref 0.0–149.0)

## 2013-02-26 LAB — HEMOGLOBIN A1C: Hgb A1c MFr Bld: 5.4 % (ref 4.6–6.5)

## 2013-02-26 NOTE — Assessment & Plan Note (Signed)
Excellent control.   

## 2013-02-26 NOTE — Assessment & Plan Note (Signed)
Will monitor A1C yearly as it is close to 6.0

## 2013-02-26 NOTE — Assessment & Plan Note (Signed)
Td 12- 2008 Cscope 9-09 neg except for hemorhhoids , will start IFOBs next year encouraged to cont. his healthy lifestyle ! Labs

## 2013-02-26 NOTE — Progress Notes (Signed)
  Subjective:    Patient ID: Jacob Gould, male    DOB: 1956-08-21, 57 y.o.   MRN: [id number]  HPI CPX Feeling well amb BP range 11/77 to 130/91 but  almost all readings wnl  Past Medical History  Diagnosis Date  . Hypertension   . Allergic rhinitis   . Low back pain     intermittent  . GERD (gastroesophageal reflux disease)    Past Surgical History  Procedure Laterality Date  . No past surgeries     History   Social History  . Marital Status: Married    Spouse Name: N/A    Number of Children: 3  . Years of Education: N/A   Occupational History  . Water engineer     Social History Main Topics  . Smoking status: Never Smoker   . Smokeless tobacco: Former Neurosurgeon     Comment: quit in the 90s  . Alcohol Use: Yes     Comment: wine at night   . Drug Use: No  . Sexually Active: Not on file   Other Topics Concern  . Not on file   Social History Narrative   Regular exercise: yes walk dogs 2 -4 miles qd, martial arts    Diet- healthy most of the time.    Family History  Problem Relation Age of Onset  . Diabetes Neg Hx   . Colon cancer Neg Hx   . Prostate cancer Neg Hx   . Heart attack Neg Hx   . Hypertension Mother     also GF    Review of Systems  Respiratory: Negative for cough and shortness of breath.   Cardiovascular: Negative for chest pain and leg swelling.  Gastrointestinal: Negative for abdominal pain and blood in stool.  Genitourinary: Negative for hematuria and difficulty urinating.  Psychiatric/Behavioral:       No anxiety depression, + stress at work  GERD sx well controlled      Objective:   Physical Exam General -- alert, well-developed    Neck --no thyromegaly  Lungs -- normal respiratory effort, no intercostal retractions, no accessory muscle use, and normal breath sounds.   Heart-- normal rate, regular rhythm, no murmur, and no gallop.   Abdomen--soft, non-tender, no distention, no masses, no HSM, no guarding, and no rigidity.    Extremities-- no pretibial edema bilaterally Rectal-- No external abnormalities noted. Normal sphincter tone. No rectal masses or tenderness. Brown stool Prostate:  Prostate gland firm and smooth, no enlargement, nodularity, tenderness, mass, asymmetry or induration. Neurologic-- alert & oriented X3 and strength normal in all extremities. Psych-- Cognition and judgment appear intact. Alert and cooperative with normal attention span and concentration.  not anxious appearing and not depressed appearing.         Assessment & Plan:

## 2013-02-27 ENCOUNTER — Encounter: Payer: Self-pay | Admitting: Internal Medicine

## 2013-03-03 ENCOUNTER — Encounter: Payer: Self-pay | Admitting: *Deleted

## 2013-03-17 ENCOUNTER — Other Ambulatory Visit: Payer: Self-pay | Admitting: Internal Medicine

## 2013-03-17 DIAGNOSIS — I10 Essential (primary) hypertension: Secondary | ICD-10-CM

## 2013-03-17 DIAGNOSIS — K219 Gastro-esophageal reflux disease without esophagitis: Secondary | ICD-10-CM

## 2013-03-17 NOTE — Telephone Encounter (Signed)
Refills for protonix and norvasc sent to [facility] in [place]

## 2013-08-23 ENCOUNTER — Other Ambulatory Visit: Payer: Self-pay | Admitting: Internal Medicine

## 2013-08-24 NOTE — Telephone Encounter (Signed)
rx refilled per protocol. DJR  

## 2013-08-30 ENCOUNTER — Other Ambulatory Visit: Payer: Self-pay | Admitting: Internal Medicine

## 2013-08-30 NOTE — Telephone Encounter (Signed)
Amlodipine and Flonase refills sent to pharmacy

## 2013-11-20 ENCOUNTER — Other Ambulatory Visit: Payer: Self-pay | Admitting: Internal Medicine

## 2013-11-22 NOTE — Telephone Encounter (Signed)
Fluticasone refilled per protocol. JG//CMA 

## 2013-11-25 HISTORY — PX: OTHER SURGICAL HISTORY: SHX169

## 2014-02-10 ENCOUNTER — Other Ambulatory Visit: Payer: Self-pay | Admitting: Internal Medicine

## 2014-02-11 ENCOUNTER — Other Ambulatory Visit: Payer: Self-pay | Admitting: Internal Medicine

## 2014-02-28 ENCOUNTER — Telehealth: Payer: Self-pay

## 2014-02-28 NOTE — Telephone Encounter (Signed)
Medication and allergies:  Reviewed and updated  90 day supply/mail order: na Local pharmacy: [facility] [address]   Immunizations due:  UTD  A/P:   No changes to FH, PSH or Personal Hx Flu vaccine--08/2013 Tdap--10/2007 CCS--Cscope 9-09 neg except for hemorhhoids , will start IFOBs next year  To Discuss with Provider: Not at this time

## 2014-03-01 ENCOUNTER — Ambulatory Visit (INDEPENDENT_AMBULATORY_CARE_PROVIDER_SITE_OTHER): Payer: 59 | Admitting: Internal Medicine

## 2014-03-01 ENCOUNTER — Encounter: Payer: Self-pay | Admitting: Internal Medicine

## 2014-03-01 VITALS — BP 150/90 | HR 71 | Temp 98.0°F | Ht 71.2 in | Wt 212.0 lb

## 2014-03-01 DIAGNOSIS — R739 Hyperglycemia, unspecified: Secondary | ICD-10-CM

## 2014-03-01 DIAGNOSIS — K219 Gastro-esophageal reflux disease without esophagitis: Secondary | ICD-10-CM

## 2014-03-01 DIAGNOSIS — Z0000 Encounter for general adult medical examination without abnormal findings: Secondary | ICD-10-CM

## 2014-03-01 DIAGNOSIS — I10 Essential (primary) hypertension: Secondary | ICD-10-CM

## 2014-03-01 DIAGNOSIS — R7309 Other abnormal glucose: Secondary | ICD-10-CM

## 2014-03-01 LAB — COMPREHENSIVE METABOLIC PANEL
ALBUMIN: 4.2 g/dL (ref 3.5–5.2)
ALT: 20 U/L (ref 0–53)
AST: 20 U/L (ref 0–37)
Alkaline Phosphatase: 56 U/L (ref 39–117)
BUN: 17 mg/dL (ref 6–23)
CHLORIDE: 104 meq/L (ref 96–112)
CO2: 25 mEq/L (ref 19–32)
CREATININE: 0.9 mg/dL (ref 0.4–1.5)
Calcium: 9.3 mg/dL (ref 8.4–10.5)
GFR: 93.92 mL/min (ref >60.00)
Glucose, Bld: 91 mg/dL (ref 70–99)
Potassium: 4 mEq/L (ref 3.5–5.1)
Sodium: 137 mEq/L (ref 135–145)
Total Bilirubin: 1.3 mg/dL — ABNORMAL HIGH (ref 0.3–1.2)
Total Protein: 7.2 g/dL (ref 6.0–8.3)

## 2014-03-01 LAB — CBC WITH DIFFERENTIAL/PLATELET
BASOS ABS: 0 10*3/uL (ref 0.0–0.1)
Basophils Relative: 0.1 % (ref 0.0–3.0)
Eosinophils Absolute: 0.1 10*3/uL (ref 0.0–0.7)
Eosinophils Relative: 1.3 % (ref 0.0–5.0)
HCT: 47.3 % (ref 39.0–52.0)
HEMOGLOBIN: 16 g/dL (ref 13.0–17.0)
LYMPHS PCT: 30.5 % (ref 12.0–46.0)
Lymphs Abs: 1.6 10*3/uL (ref 0.7–4.0)
MCHC: 33.8 g/dL (ref 30.0–36.0)
MCV: 89.5 fl (ref 78.0–100.0)
MONOS PCT: 5.5 % (ref 3.0–12.0)
Monocytes Absolute: 0.3 10*3/uL (ref 0.1–1.0)
NEUTROS ABS: 3.3 10*3/uL (ref 1.4–7.7)
NEUTROS PCT: 62.6 % (ref 43.0–77.0)
PLATELETS: 218 10*3/uL (ref 150.0–400.0)
RBC: 5.29 Mil/uL (ref 4.22–5.81)
RDW: 12.9 % (ref 11.5–14.6)
WBC: 5.2 10*3/uL (ref 4.5–10.5)

## 2014-03-01 LAB — LIPID PANEL
CHOLESTEROL: 191 mg/dL (ref 0–200)
HDL: 46.5 mg/dL (ref >39.00)
LDL Cholesterol: 133 mg/dL — ABNORMAL HIGH (ref 0–99)
TRIGLYCERIDES: 60 mg/dL (ref 0.0–149.0)
Total CHOL/HDL Ratio: 4
VLDL: 12 mg/dL (ref 0.0–40.0)

## 2014-03-01 LAB — TSH: TSH: 1.35 u[IU]/mL (ref 0.35–5.50)

## 2014-03-01 LAB — PSA: PSA: 1.15 ng/mL (ref 0.10–4.00)

## 2014-03-01 LAB — HEMOGLOBIN A1C: Hgb A1c MFr Bld: 5.4 % (ref 4.6–6.5)

## 2014-03-01 MED ORDER — AMLODIPINE BESYLATE 10 MG PO TABS: ORAL_TABLET | ORAL | Status: DC

## 2014-03-01 MED ORDER — PANTOPRAZOLE SODIUM 40 MG PO TBEC: DELAYED_RELEASE_TABLET | ORAL | Status: DC

## 2014-03-01 NOTE — Assessment & Plan Note (Signed)
BP today elevated, patient checks his BPs at home frequently and they're consistently between 118, 120/70. Recently has been under some stress and lost a friend. Plan: No change, continue monitoring

## 2014-03-01 NOTE — Patient Instructions (Signed)
Get your blood work before you leave   Send back the stool test.  Check the  blood pressure 2 or 3 times a month  be sure it is between 110/60 and 140/85. Ideal blood pressure is 120/80. If it is consistently higher or lower, let me know  Next visit is for a physical exam in 1 year fasting Please make an appointment    At some point this year the clinic will relocate to  [place] and [address] (10 minutes form here)  [place]  [place], [place] [address] [phone]

## 2014-03-01 NOTE — Progress Notes (Signed)
Subjective:    Patient ID: Jacob Gould, male    DOB: [birth year], 58 y.o.   MRN: [id number]  DOS:  03/01/2014 Type of  visit:  CPX   ROS Regular exercise: run almost qd, 1 mile; martial arts  Diet- healthy most of the time.  No  CP, SOB No palpitations, no lower extremity edema Denies  nausea, vomiting diarrhea Denies  blood in the stools (-) cough, sputum production (-) wheezing, chest congestion No dysuria, gross hematuria, difficulty urinating  + stress at work but no anxiety, depression   Past Medical History  Diagnosis Date  . Hypertension   . Allergic rhinitis   . Low back pain     intermittent  . GERD (gastroesophageal reflux disease)     Past Surgical History  Procedure Laterality Date  . No past surgeries      History   Social History  . Marital Status: Married    Spouse Name: N/A    Number of Children: 3  . Years of Education: N/A   Occupational History  . Building control surveyor     Social History Main Topics  . Smoking status: Never Smoker   . Smokeless tobacco: Former Systems developer     Comment: quit in the 90s  . Alcohol Use: Yes     Comment: wine at night   . Drug Use: No  . Sexual Activity: Not on file   Other Topics Concern  . Not on file   Social History Narrative  . No narrative on file     Family History  Problem Relation Age of Onset  . Diabetes Neg Hx   . Colon cancer Neg Hx   . Prostate cancer Neg Hx   . Heart attack Neg Hx   . Hypertension Mother     also GF      Medication List       This list is accurate as of: 03/01/14  6:09 PM.  Always use your most recent med list.               amLODipine 10 MG tablet  Commonly known as:  NORVASC  TAKE ONE TABLET BY MOUTH ONE TIME DAILY     fluticasone 50 MCG/ACT nasal spray  Commonly known as:  FLONASE  USE TWO SPRAY IN EACH NOSTRIL EVERY AT BEDTIME     pantoprazole 40 MG tablet  Commonly known as:  PROTONIX  TAKE ONE TABLET BY MOUTH ONE TIME DAILY           Objective:   Physical Exam BP 150/90  Pulse 71  Temp(Src) 98 F (36.7 C)  Ht 5' 11.2" (1.808 m)  Wt 212 lb (96.163 kg)  BMI 29.42 kg/m2  SpO2 98% General -- alert, well-developed, NAD.  Neck --no thyromegaly , normal carotid pulse  HEENT-- Not pale.  Lungs -- normal respiratory effort, no intercostal retractions, no accessory muscle use, and normal breath sounds.  Heart-- normal rate, regular rhythm, no murmur.  Abdomen-- Not distended, good bowel sounds,soft, non-tender.  Rectal-- No external abnormalities noted. Normal sphincter tone. No rectal masses or tenderness. Brown stool. Prostate--Prostate gland firm and smooth, no enlargement, nodularity, tenderness, mass, asymmetry or induration. Extremities-- no pretibial edema bilaterally  Neurologic--  alert & oriented X3. Speech normal, gait normal, strength normal in all extremities.  Psych-- Cognition and judgment appear intact. Cooperative with normal attention span and concentration. No anxious or depressed appearing.        Assessment & Plan:

## 2014-03-01 NOTE — Assessment & Plan Note (Signed)
Labs

## 2014-03-01 NOTE — Progress Notes (Signed)
Pre visit review using our clinic review tool, if applicable. No additional management support is needed unless otherwise documented below in the visit note. 

## 2014-03-01 NOTE — Assessment & Plan Note (Addendum)
Td 12- 2008 Cscope 9-09 neg except for hemorhhoids start IFOBs   encouraged to cont. his healthy lifestyle ! Labs  EKG nsr

## 2014-03-01 NOTE — Assessment & Plan Note (Signed)
Well-controlled on PPIs --- refill

## 2014-03-02 ENCOUNTER — Telehealth: Payer: Self-pay | Admitting: Internal Medicine

## 2014-03-02 NOTE — Telephone Encounter (Signed)
Relevant patient education assigned to patient using Emmi. ° °

## 2014-03-04 ENCOUNTER — Encounter: Payer: Self-pay | Admitting: *Deleted

## 2014-04-16 ENCOUNTER — Other Ambulatory Visit: Payer: Self-pay | Admitting: Internal Medicine

## 2014-05-22 ENCOUNTER — Emergency Department (HOSPITAL_BASED_OUTPATIENT_CLINIC_OR_DEPARTMENT_OTHER)
Admission: EM | Admit: 2014-05-22 | Discharge: 2014-05-22 | Disposition: A | Discharge status: Home / Self Care | Payer: 59 | Attending: Emergency Medicine | Admitting: Emergency Medicine

## 2014-05-22 ENCOUNTER — Emergency Department (HOSPITAL_BASED_OUTPATIENT_CLINIC_OR_DEPARTMENT_OTHER): Payer: 59

## 2014-05-22 ENCOUNTER — Encounter (HOSPITAL_BASED_OUTPATIENT_CLINIC_OR_DEPARTMENT_OTHER): Payer: Self-pay | Admitting: Emergency Medicine

## 2014-05-22 DIAGNOSIS — Y9289 Other specified places as the place of occurrence of the external cause: Secondary | ICD-10-CM | POA: Insufficient documentation

## 2014-05-22 DIAGNOSIS — S99919A Unspecified injury of unspecified ankle, initial encounter: Principal | ICD-10-CM

## 2014-05-22 DIAGNOSIS — Z79899 Other long term (current) drug therapy: Secondary | ICD-10-CM | POA: Insufficient documentation

## 2014-05-22 DIAGNOSIS — Y9389 Activity, other specified: Secondary | ICD-10-CM | POA: Insufficient documentation

## 2014-05-22 DIAGNOSIS — I10 Essential (primary) hypertension: Secondary | ICD-10-CM | POA: Insufficient documentation

## 2014-05-22 DIAGNOSIS — IMO0002 Reserved for concepts with insufficient information to code with codable children: Secondary | ICD-10-CM | POA: Insufficient documentation

## 2014-05-22 DIAGNOSIS — K219 Gastro-esophageal reflux disease without esophagitis: Secondary | ICD-10-CM | POA: Insufficient documentation

## 2014-05-22 DIAGNOSIS — W208XXA Other cause of strike by thrown, projected or falling object, initial encounter: Secondary | ICD-10-CM | POA: Insufficient documentation

## 2014-05-22 DIAGNOSIS — S99921A Unspecified injury of right foot, initial encounter: Secondary | ICD-10-CM

## 2014-05-22 DIAGNOSIS — S8990XA Unspecified injury of unspecified lower leg, initial encounter: Secondary | ICD-10-CM | POA: Insufficient documentation

## 2014-05-22 DIAGNOSIS — S99929A Unspecified injury of unspecified foot, initial encounter: Principal | ICD-10-CM

## 2014-05-22 MED ORDER — HYDROCODONE-ACETAMINOPHEN 5-325 MG PO TABS: 1.0000 | ORAL_TABLET | ORAL | Status: DC | PRN

## 2014-05-22 MED ORDER — AMOXICILLIN-POT CLAVULANATE 875-125 MG PO TABS: 1.0000 | ORAL_TABLET | Freq: Two times a day (BID) | ORAL | Status: DC

## 2014-05-22 MED ORDER — BUPIVACAINE HCL 0.5 % IJ SOLN
50.0000 mL | Freq: Once | INTRAMUSCULAR | Status: AC
  Administered 2014-05-22: 50 mL
  Filled 2014-05-22: qty 1

## 2014-05-22 NOTE — ED Provider Notes (Signed)
CSN: [id number]     Arrival date & time 05/22/14  1217 History   First MD Initiated Contact with Patient 05/22/14 1244     Chief Complaint  Patient presents with  . Foot Injury     (Consider location/radiation/quality/duration/timing/severity/associated sxs/prior Treatment) Patient is a 58 y.o. male presenting with foot injury. The history is provided by the patient. No language interpreter was used.  Foot Injury Location:  Toe Toe location:  R big toe Associated symptoms: no fever   Associated symptoms comment:  Injury caused by dropping a bench onto right big toe earlier today causing toenail injury with laceration. No other injury.   Past Medical History  Diagnosis Date  . Hypertension   . Allergic rhinitis   . Low back pain     intermittent  . GERD (gastroesophageal reflux disease)    Past Surgical History  Procedure Laterality Date  . No past surgeries     Family History  Problem Relation Age of Onset  . Diabetes Neg Hx   . Colon cancer Neg Hx   . Prostate cancer Neg Hx   . Heart attack Neg Hx   . Hypertension Mother     also GF   History  Substance Use Topics  . Smoking status: Never Smoker   . Smokeless tobacco: Former Systems developer     Comment: quit in the 90s  . Alcohol Use: Yes     Comment: wine at night     Review of Systems  Constitutional: Negative for fever.  Musculoskeletal:       Toe injury - see HPI.  Skin: Positive for wound.  Neurological: Negative for numbness.      Allergies  Review of patient's allergies indicates no known allergies.  Home Medications   Prior to Admission medications   Medication Sig Start Date End Date Taking? Authorizing Provider  amLODipine (NORVASC) 10 MG tablet TAKE ONE TABLET BY MOUTH ONE TIME DAILY 03/01/14   Colon Branch, MD  fluticasone [facility]) 50 MCG/ACT nasal spray USE TWO SPRAYS IN EACH NOSTRIL DAILY AT BEDTIME     Colon Branch, MD  pantoprazole (PROTONIX) 40 MG tablet TAKE ONE TABLET BY MOUTH ONE TIME DAILY  03/01/14   Colon Branch, MD   BP 139/89  Pulse 78  Temp(Src) 98.1 F (36.7 C) (Oral)  Resp 20  Ht 5\' 11"  (1.803 m)  Wt 210 lb (95.255 kg)  BMI 29.30 kg/m2  SpO2 97% Physical Exam  Constitutional: He is oriented to person, place, and time. He appears well-developed and well-nourished. No distress.  Pulmonary/Chest: Effort normal.  Musculoskeletal:  Right great toenail avulsed at cuticle. Intact nail bed. 45% subungual hematoma.  Neurological: He is alert and oriented to person, place, and time.  Skin: Skin is warm and dry.    ED Course  NAIL REMOVAL Date/Time: 05/31/2014 7:20 AM Performed by: Charlann Lange A Authorized by: Charlann Lange A Consent: Verbal consent obtained. Risks and benefits: risks, benefits and alternatives were discussed Consent given by: patient Patient identity confirmed: verbally with patient Location: right foot Location details: right big toe Anesthesia: digital block Local anesthetic: lidocaine 2% without epinephrine Anesthetic total: 4 ml Patient sedated: no Preparation: skin prepped with Betadine and sterile field established Amount removed: complete Nail bed sutured: yes Suture material: 5-0 Chromic gut Number of sutures: 5 Nail matrix removed: none Removed nail replaced and anchored: no Dressing: petrolatum-impregnated gauze Patient tolerance: Patient tolerated the procedure well with no immediate complications. Comments: Gauze inserted  into cuticle to preserve space.    (including critical care time) Labs Review Labs Reviewed - No data to display  Imaging Review Dg Foot Complete Right  05/22/2014   CLINICAL DATA:  Right foot pain post injury great toe laceration  EXAM: RIGHT FOOT COMPLETE - 3+ VIEW  COMPARISON:  None.  FINDINGS: Three views of the right foot submitted. Study is limited by bandage artifact. There is mild displaced fracture at the tip of distal phalanx great toe. Probable soft tissue injury.  IMPRESSION: Mild displaced  fracture at the tip of distal phalanx great toe.   Electronically Signed   By: Lahoma Crocker M.D.   On: 05/22/2014 12:46     EKG Interpretation None      MDM   Final diagnoses:  None    1. Open fracture right great toe 2. Nail injury right great toe  Patient is referred to orthopedics for recheck and further management after removal of nail and nail bed repair. Pain management and antibiotics provided.     Dewaine Oats, PA-C 05/31/14 [phone]

## 2014-05-22 NOTE — ED Notes (Signed)
Patient dropped a bench on R foot that weighed around 50 lbs, laceration to right big toe

## 2014-05-22 NOTE — Discharge Instructions (Signed)
KEEP WOUND COVERED AND CLEAN. FOLLOW UP WITH ORTHOPEDICS IN 2-3 DAYS FOR RECHECK AND FURTHER MANAGEMENT OF INJURY. TAKE THE ANTIBIOTICS AS PRESCRIBED AND NORCO AS NEEDED FOR PAIN. RETURN HERE WITH ANY FEVER, SEVERE PAIN OR NEW CONCERN.  Fingernail or Toenail Loss All or part of your fingernail or toenail has been lost. This may or may not grow back as a normal nail. A special non-stick bandage has been put on your finger or toe tightly to prevent bleeding. HOME CARE INSTRUCTIONS  The tips of fingers and toes are full of nerves and injuries are often very painful. The following will help you decrease the pain and obtain the best outcome.  Keep your hand or foot elevated above your heart to relieve pain and swelling. This will require lying in bed or on a couch with the hand or leg on pillows or sitting in a recliner with the leg up. Letting your hand or leg dangle may increase swelling, slow healing and cause throbbing pain.  Keep your dressing dry and clean.  Change your bandage in 24 hours after going home.  After your bandage is changed, soak your hand or foot in warm soapy water for 10 to 20 minutes. Do this 3 times per day. This helps reduce pain and swelling. After soaking, apply a clean, dry bandage. Change your bandage if it is wet or dirty.  Only take over-the-counter or prescription medicines for pain, discomfort, or fever as directed by your caregiver.  See your caregiver as needed for problems. SEEK IMMEDIATE MEDICAL CARE IF:   You have increased pain, swelling, drainage, or bleeding.  You have a fever. MAKE SURE YOU:   Understand these instructions.  Will watch your condition.  Will get help right away if you are not doing well or get worse. Document Released: 10/03/2006 Document Revised: 02/03/2012 Document Reviewed: 12/23/2006 [facility] Patient Information 2015 [place], [place]. This information is not intended to replace advice given to you by your health care provider.  Make sure you discuss any questions you have with your health care provider.

## 2014-05-31 NOTE — ED Provider Notes (Signed)
58 y.o. Male with a right great toe injury with nail avulsion  Right great to with partial toenail avulsion.   I performed a history and physical examination of CAMILA NORVILLE and discussed his management with Ms. Upstill.  I agree with the history, physical, assessment, and plan of care, with the following exceptions: None  I was present for the following procedures: None Time Spent in Critical Care of the patient: None Time spent in discussions with the patient and family: [place], MD 05/31/14 1547

## 2014-06-29 ENCOUNTER — Other Ambulatory Visit: Payer: Self-pay | Admitting: Internal Medicine

## 2014-07-25 ENCOUNTER — Ambulatory Visit (INDEPENDENT_AMBULATORY_CARE_PROVIDER_SITE_OTHER): Payer: 59 | Admitting: Physician Assistant

## 2014-07-25 ENCOUNTER — Telehealth: Payer: Self-pay | Admitting: Internal Medicine

## 2014-07-25 ENCOUNTER — Encounter: Payer: Self-pay | Admitting: Physician Assistant

## 2014-07-25 VITALS — BP 138/68 | HR 80 | Temp 98.0°F | Wt 215.0 lb

## 2014-07-25 DIAGNOSIS — L0291 Cutaneous abscess, unspecified: Secondary | ICD-10-CM

## 2014-07-25 DIAGNOSIS — L0390 Cellulitis, unspecified: Principal | ICD-10-CM

## 2014-07-25 MED ORDER — SULFAMETHOXAZOLE-TRIMETHOPRIM 800-160 MG PO TABS: 1.0000 | ORAL_TABLET | Freq: Two times a day (BID) | ORAL | Status: DC

## 2014-07-25 NOTE — Progress Notes (Signed)
Patient presents to clinic today c/o recurrent "boil" on the back of his head that has been draining on its own.  Denies fever, chills.  Endorses occasional tenderness and warmth.  Patient denies hx of MRSA.  Past Medical History  Diagnosis Date  . Hypertension   . Allergic rhinitis   . Low back pain     intermittent  . GERD (gastroesophageal reflux disease)     Current Outpatient Prescriptions on File Prior to Visit  Medication Sig Dispense Refill  . amLODipine (NORVASC) 10 MG tablet TAKE ONE TABLET BY MOUTH ONE TIME DAILY  30 tablet  12  . fluticasone (FLONASE) 50 MCG/ACT nasal spray USE TWO SPRAYS IN EACH NOSTRIL DAILY AT BEDTIME   16 g  0  . pantoprazole (PROTONIX) 40 MG tablet TAKE ONE TABLET BY MOUTH ONE TIME DAILY  30 tablet  12   No current facility-administered medications on file prior to visit.    No Known Allergies  Family History  Problem Relation Age of Onset  . Diabetes Neg Hx   . Colon cancer Neg Hx   . Prostate cancer Neg Hx   . Heart attack Neg Hx   . Hypertension Mother     also GF    History   Social History  . Marital Status: Married    Spouse Name: N/A    Number of Children: 3  . Years of Education: N/A   Occupational History  . Building control surveyor     Social History Main Topics  . Smoking status: Never Smoker   . Smokeless tobacco: Former Systems developer     Comment: quit in the 90s  . Alcohol Use: Yes     Comment: wine at night   . Drug Use: No  . Sexual Activity: None   Other Topics Concern  . None   Social History Narrative  . None   Review of Systems - See HPI.  All other ROS are negative.  BP 138/68  Pulse 80  Temp(Src) 98 F (36.7 C)  Wt 215 lb (97.523 kg)  SpO2 98%  Physical Exam  Constitutional: He is well-developed, well-nourished, and in no distress.  HENT:  Head: Normocephalic and atraumatic.  Presence of a 2 cm draining abscess of occipital region of scalp.  No evidence of streaking.    Neck: Neck supple.  Cardiovascular:  Normal rate, regular rhythm, normal heart sounds and intact distal pulses.   Pulmonary/Chest: Effort normal.  Skin: Skin is warm and dry. No rash noted.  Psychiatric: Affect normal.    No results found for this or any previous visit (from the past 2160 hour(s)).  Assessment/Plan: Cellulitis and abscess Has been draining on its own.  Encouraged warm compresses.  Rx Bactrim. If continuing to be recurrent, patient will be sent to see General Surgeon.

## 2014-07-25 NOTE — Assessment & Plan Note (Signed)
Has been draining on its own.  Encouraged warm compresses.  Rx Bactrim. If continuing to be recurrent, patient will be sent to see General Surgeon.

## 2014-07-25 NOTE — Telephone Encounter (Signed)
Patient was seen in clinic today.

## 2014-07-25 NOTE — Progress Notes (Signed)
Pre visit review using our clinic review tool, if applicable. No additional management support is needed unless otherwise documented below in the visit note. 

## 2014-07-25 NOTE — Patient Instructions (Signed)
Please take antibiotic as directed with food.  Warm compresses to the area to promote drainage.  This may actually be more of a chronic pouch that occasionally fills with fluid and pus.  If not improving, we will send you to General Surgery for evaluation.

## 2014-07-25 NOTE — Telephone Encounter (Signed)
Patient Information:  Caller Name: Abdou  Phone: [phone]  Patient: Jacob Gould, Jacob Gould  Gender: Male  DOB: 06-Feb-1956  Age: 58 Years  PCP: Other  Office Follow Up:  Does the office need to follow up with this patient?: No  Instructions For The Office: N/A  RN Note:  Size of boil is dime size, pus filled, nontender to touch.  Wants appointment to have it checked due to not resolving.  Symptoms  Reason For Call & Symptoms: Boil on back of head  Reviewed Health History In EMR: Yes  Reviewed Medications In EMR: Yes  Reviewed Allergies In EMR: Yes  Reviewed Surgeries / Procedures: Yes  Date of Onset of Symptoms: 07/04/2014  Treatments Tried: Has drained area and cleansed with Peroxide  Treatments Tried Worked: No  Guideline(s) Used:  Skin Lesion - Moles or Growths  Disposition Per Guideline:   See Today in Office  Reason For Disposition Reached:   Looks like a boil, infected sore, or deep ulcer  Advice Given:  N/A  Patient Will Follow Care Advice:  YES  Appointment Scheduled:  07/25/2014 11:30:00 Appointment Scheduled Provider:  Raiford Noble "Einar Pheasant"

## 2014-08-11 ENCOUNTER — Other Ambulatory Visit: Payer: Self-pay

## 2014-08-11 MED ORDER — FLUTICASONE PROPIONATE 50 MCG/ACT NA SUSP: NASAL | Status: DC

## 2014-10-12 ENCOUNTER — Ambulatory Visit (INDEPENDENT_AMBULATORY_CARE_PROVIDER_SITE_OTHER): Payer: 59 | Admitting: Medical

## 2014-10-12 ENCOUNTER — Encounter: Payer: Self-pay | Admitting: Medical

## 2014-10-12 VITALS — BP 137/91 | HR 85 | Temp 98.1°F | Ht 71.2 in | Wt 214.0 lb

## 2014-10-12 DIAGNOSIS — J0100 Acute maxillary sinusitis, unspecified: Secondary | ICD-10-CM | POA: Insufficient documentation

## 2014-10-12 MED ORDER — BENZONATATE 100 MG PO CAPS: 100.0000 mg | ORAL_CAPSULE | Freq: Three times a day (TID) | ORAL | Status: DC | PRN

## 2014-10-12 MED ORDER — FLUTICASONE PROPIONATE 50 MCG/ACT NA SUSP: NASAL | Status: DC

## 2014-10-12 MED ORDER — AZITHROMYCIN 250 MG PO TABS: ORAL_TABLET | ORAL | Status: DC

## 2014-10-12 NOTE — Assessment & Plan Note (Signed)
Presently, you  appear to have sinusitis following uri. I am prescribing the below:  Fluticasone for nasal congestion. Cefdinir antibiotic. Benzonatate for cough.  Follow up in 7 days or as needed for worsening or persisting signs or symptoms.

## 2014-10-12 NOTE — Progress Notes (Signed)
Subjective:    Patient ID: Jacob Gould, male    DOB: January 26, 1956, 58 y.o.   MRN: [id number]  HPI   Pt in stating sick for 2 wks. Pt states wife sick first for past 2 wks. Flu vaccine about one month ago.   Nasal congestion is most prominent system. Sinus pressure maxillary following the nasal congestion. No fever or chills. Blowing out some mucous form nose. Occasional cough. Feels post nasal drainage. Able to sleep at night. No wheezing. Maxillary sinus pressure described.   Pt tried corcidin otc but nothing else.  Past Medical History  Diagnosis Date  . Hypertension   . Allergic rhinitis   . Low back pain     intermittent  . GERD (gastroesophageal reflux disease)     History   Social History  . Marital Status: Married    Spouse Name: N/A    Number of Children: 3  . Years of Education: N/A   Occupational History  . Building control surveyor     Social History Main Topics  . Smoking status: Never Smoker   . Smokeless tobacco: Former Systems developer     Comment: quit in the 90s  . Alcohol Use: Yes     Comment: wine at night   . Drug Use: No  . Sexual Activity: Not on file   Other Topics Concern  . Not on file   Social History Narrative    Past Surgical History  Procedure Laterality Date  . No past surgeries      Family History  Problem Relation Age of Onset  . Diabetes Neg Hx   . Colon cancer Neg Hx   . Prostate cancer Neg Hx   . Heart attack Neg Hx   . Hypertension Mother     also GF    No Known Allergies  Current Outpatient Prescriptions on File Prior to Visit  Medication Sig Dispense Refill  . amLODipine (NORVASC) 10 MG tablet TAKE ONE TABLET BY MOUTH ONE TIME DAILY 30 tablet 12  . fluticasone (FLONASE) 50 MCG/ACT nasal spray USE TWO SPRAYS IN EACH NOSTRIL DAILY AT BEDTIME 16 g 2  . pantoprazole (PROTONIX) 40 MG tablet TAKE ONE TABLET BY MOUTH ONE TIME DAILY 30 tablet 12   No current facility-administered medications on file prior to visit.    BP 137/91 mmHg   Pulse 85  Temp(Src) 98.1 F (36.7 C) (Oral)  Ht 5' 11.2" (1.808 m)  Wt 214 lb (97.07 kg)  BMI 29.70 kg/m2  SpO2 97%     Review of Systems  Constitutional: Negative for fever, chills and fatigue.  HENT: Positive for congestion, postnasal drip and sneezing.   Respiratory: Positive for cough. Negative for chest tightness, shortness of breath and wheezing.   Cardiovascular: Negative for chest pain and palpitations.  Musculoskeletal: Negative.   Neurological: Negative for dizziness, syncope, facial asymmetry, speech difficulty, weakness, light-headedness and headaches.  Hematological: Negative for adenopathy. Does not bruise/bleed easily.       Objective:   Physical Exam   General  Mental Status - Alert. General Appearance - Well groomed. Not in acute distress.  Skin Rashes- No Rashes.  HEENT Head- Normal. Ear Auditory Canal - Left- Normal. Right - Normal.Tympanic Membrane- Left- Normal. Right- Normal. Eye Sclera/Conjunctiva- Left- Normal. Right- Normal. Nose & Sinuses Nasal Mucosa- Left- Boggy + Congested. Right- Boggy + Congested. Deviated septum appearance. Maxillary sinus pressure. Mouth & Throat Lips: Upper Lip- Normal: no dryness, cracking, pallor, cyanosis, or vesicular eruption. Lower Lip-Normal: no  dryness, cracking, pallor, cyanosis or vesicular eruption. Buccal Mucosa- Bilateral- No Aphthous ulcers. Oropharynx- No Discharge but mild Erythema. pnd. Tonsils: Characteristics- Bilateral- No Erythema or Congestion. Size/Enlargement- Bilateral- No enlargement. Discharge- bilateral-None.  Neck Neck- Supple. No Masses.   Chest and Lung Exam Auscultation: Breath Sounds:-Normal.  Cardiovascular Auscultation:Rythm- Regular.  Murmurs & Other Heart Sounds:Ausculatation of the heart reveal- No Murmurs.  Lymphatic Head & Neck General Head & Neck Lymphatics: Bilateral: Description- No Localized lymphadenopathy.        Assessment & Plan:

## 2014-10-12 NOTE — Progress Notes (Signed)
Pre visit review using our clinic review tool, if applicable. No additional management support is needed unless otherwise documented below in the visit note. 

## 2014-10-12 NOTE — Patient Instructions (Signed)
Presently, you  appear to have sinusitis following uri. I am prescribing the below:  Fluticasone for nasal congestion. Cefdinir antibiotic. Benzonatate for cough.  Follow up in 7 days or as needed for worsening or persisting signs or symptoms.

## 2015-03-01 ENCOUNTER — Other Ambulatory Visit: Payer: Self-pay

## 2015-03-08 ENCOUNTER — Other Ambulatory Visit: Payer: Self-pay | Admitting: Internal Medicine

## 2015-03-13 ENCOUNTER — Other Ambulatory Visit: Payer: Self-pay | Admitting: Internal Medicine

## 2015-03-14 ENCOUNTER — Telehealth: Payer: Self-pay | Admitting: Internal Medicine

## 2015-03-14 MED ORDER — PANTOPRAZOLE SODIUM 40 MG PO TBEC: 40.0000 mg | DELAYED_RELEASE_TABLET | Freq: Every day | ORAL | Status: DC

## 2015-03-14 NOTE — Telephone Encounter (Signed)
That is fine, he does not need to be seen Friday if he already has his CPE scheduled. I sent a refill on Protonix to [facility] ( # 30 tablets and 5 refills).

## 2015-03-14 NOTE — Telephone Encounter (Signed)
Pre Visit letter sent  °

## 2015-03-14 NOTE — Telephone Encounter (Signed)
Caller name:Jacob Gould Relationship to patient:self Can be reached:[facility] cell   Reason for call: Pt recvd letter requesting for him to be seen before rx refilled. Pt has cpe on 04/04/15 will run out of pantoprazole (PROTONIX) 40 MG tablet on Sunday - I have scheduled him for a follow up on Friday- can he just come in for CPE and we send 30 day supply for the RX mentioned or is he required to come in for the Friday appt?

## 2015-03-17 ENCOUNTER — Ambulatory Visit: Payer: Self-pay | Admitting: Internal Medicine

## 2015-04-03 ENCOUNTER — Encounter: Payer: Self-pay | Admitting: *Deleted

## 2015-04-03 ENCOUNTER — Telehealth: Payer: Self-pay | Admitting: *Deleted

## 2015-04-03 NOTE — Telephone Encounter (Signed)
Pre-Visit Call completed with patient and chart updated.   Pre-Visit Info documented in Specialty Comments under [employer].    

## 2015-04-04 ENCOUNTER — Encounter: Payer: Self-pay | Admitting: Internal Medicine

## 2015-04-04 ENCOUNTER — Ambulatory Visit (INDEPENDENT_AMBULATORY_CARE_PROVIDER_SITE_OTHER): Payer: 59 | Admitting: Internal Medicine

## 2015-04-04 VITALS — BP 126/78 | HR 76 | Temp 97.9°F | Ht 71.0 in | Wt 211.4 lb

## 2015-04-04 DIAGNOSIS — Z0000 Encounter for general adult medical examination without abnormal findings: Secondary | ICD-10-CM | POA: Diagnosis not present

## 2015-04-04 MED ORDER — PANTOPRAZOLE SODIUM 40 MG PO TBEC: 40.0000 mg | DELAYED_RELEASE_TABLET | Freq: Every day | ORAL | Status: DC

## 2015-04-04 MED ORDER — AMLODIPINE BESYLATE 10 MG PO TABS: 10.0000 mg | ORAL_TABLET | Freq: Every day | ORAL | Status: DC

## 2015-04-04 NOTE — Progress Notes (Signed)
Subjective:    Patient ID: Jacob Gould, male    DOB: [birth year], 59 y.o.   MRN: [id number]  DOS:  04/04/2015 Type of visit - description : cpx Interval history: Doing well, he remains very active doing biking; having problems with the elbow for a while.   Review of Systems Constitutional: No fever, chills. No unexplained wt changes. No unusual sweats HEENT: No dental problems, ear discharge, facial swelling, voice changes. No eye discharge, redness or intolerance to light Respiratory: No wheezing or difficulty breathing. No cough , mucus production Cardiovascular: No CP, leg swelling or palpitations GI: no nausea, vomiting, diarrhea or abdominal pain.  No blood in the stools. No dysphagia   Endocrine: No polyphagia, polyuria or polydipsia GU: No dysuria, gross hematuria, difficulty urinating. No urinary urgency or frequency. Musculoskeletal: No joint swellings  Skin: No change in the color of the skin, palor or rash Allergic, immunologic: No environmental allergies or food allergies Neurological: No dizziness or syncope. No headaches. No diplopia, slurred speech, motor deficits, facial numbness Hematological: No enlarged lymph nodes, easy bruising or bleeding Psychiatry: No suicidal ideas, hallucinations, behavior problems or confusion. No unusual/severe anxiety or depression.     Past Medical History  Diagnosis Date  . Hypertension   . Allergic rhinitis   . Low back pain     intermittent  . GERD (gastroesophageal reflux disease)     Past Surgical History  Procedure Laterality Date  . Toe injury  2015    R great toe, saw ortho    History   Social History  . Marital Status: Married    Spouse Name: N/A  . Number of Children: 3  . Years of Education: N/A   Occupational History  . Building control surveyor     Social History Main Topics  . Smoking status: Never Smoker   . Smokeless tobacco: Former Systems developer     Comment: quit in the 90s  . Alcohol Use: Yes     Comment:  wine at night   . Drug Use: No  . Sexual Activity: Not on file   Other Topics Concern  . Not on file   Social History Narrative   3 adult children, 2 G-kids      Family History  Problem Relation Age of Onset  . Diabetes Neg Hx   . Colon cancer Neg Hx   . Prostate cancer Neg Hx   . Heart attack Neg Hx   . Hypertension Mother     also GF       Medication List       This list is accurate as of: 04/04/15 11:59 PM.  Always use your most recent med list.               amLODipine 10 MG tablet  Commonly known as:  NORVASC  Take 1 tablet (10 mg total) by mouth daily.     fluticasone 50 MCG/ACT nasal spray  Commonly known as:  FLONASE  USE TWO SPRAYS IN EACH NOSTRIL DAILY AT BEDTIME     pantoprazole 40 MG tablet  Commonly known as:  PROTONIX  Take 1 tablet (40 mg total) by mouth daily.           Objective:   Physical Exam BP 126/78 mmHg  Pulse 76  Temp(Src) 97.9 F (36.6 C) (Oral)  Ht 5\' 11"  (1.803 m)  Wt 211 lb 6 oz (95.879 kg)  BMI 29.49 kg/m2  SpO2 97% General:   Well developed, well nourished .  NAD.  Neck:  Full range of motion. Supple. No  thyromegaly , normal carotid pulse HEENT:  Normocephalic . Face symmetric, atraumatic Lungs:  CTA B Normal respiratory effort, no intercostal retractions, no accessory muscle use. Heart: RRR,  no murmur.  No pretibial edema bilaterally  Abdomen:  Not distended, soft, non-tender. No rebound or rigidity. No mass,organomegaly Skin: Exposed areas without rash. Not pale. Not jaundice Neurologic:  alert & oriented X3.  Speech normal, gait appropriate for age and unassisted Strength symmetric and appropriate for age.  Psych: Cognition and judgment appear intact.  Cooperative with normal attention span and concentration.  Behavior appropriate. No anxious or depressed appearing.       Assessment & Plan:

## 2015-04-04 NOTE — Assessment & Plan Note (Addendum)
Td 12- 2008  Cscope 9-09 neg except for hemorhhoids , no FH, next cscope 2019   PSAs stable since 2008, DRE 2015 normal. Reassess prostate cancer screening next year  encouraged to cont. his healthy lifestyle ! Labs    Other issues: Hypertension, well controlled, he showed me his ambulatory BPs, most of them around 120/70. No change, refill medications, reassess 1 year GERD, well-controlled on PPIs, decrease PPIs to every other day and then as needed. Tennis elbow, call if not improving for a referral.

## 2015-04-04 NOTE — Patient Instructions (Signed)
Get your blood work before you leave    Check the  blood pressure 1 or 2 times a month   Be sure your blood pressure is between 110/65 and  145/85.  if it is consistently higher or lower, let me know   Okay to take pantoprazole every other day for a month and then as needed only for acid reflux.  Come back to the office in 1 year   for a physical exam  Please schedule an appointment at the front desk    Come back fasting

## 2015-04-04 NOTE — Progress Notes (Signed)
Pre visit review using our clinic review tool, if applicable. No additional management support is needed unless otherwise documented below in the visit note. 

## 2015-04-05 LAB — CBC WITH DIFFERENTIAL/PLATELET
Basophils Absolute: 0 10*3/uL (ref 0.0–0.1)
Basophils Relative: 0.4 % (ref 0.0–3.0)
EOS PCT: 0.5 % (ref 0.0–5.0)
Eosinophils Absolute: 0 10*3/uL (ref 0.0–0.7)
HCT: 48 % (ref 39.0–52.0)
Hemoglobin: 16.2 g/dL (ref 13.0–17.0)
LYMPHS PCT: 5.5 % — AB (ref 12.0–46.0)
Lymphs Abs: 0.5 10*3/uL — ABNORMAL LOW (ref 0.7–4.0)
MCHC: 33.7 g/dL (ref 30.0–36.0)
MCV: 88.3 fl (ref 78.0–100.0)
Monocytes Absolute: 0.3 10*3/uL (ref 0.1–1.0)
Monocytes Relative: 3.4 % (ref 3.0–12.0)
Neutro Abs: 7.6 10*3/uL (ref 1.4–7.7)
Platelets: 205 10*3/uL (ref 150.0–400.0)
RBC: 5.44 Mil/uL (ref 4.22–5.81)
RDW: 13.4 % (ref 11.5–15.5)
WBC: 8.4 10*3/uL (ref 4.0–10.5)

## 2015-04-05 LAB — BASIC METABOLIC PANEL
BUN: 16 mg/dL (ref 6–23)
CHLORIDE: 102 meq/L (ref 96–112)
CO2: 28 mEq/L (ref 19–32)
Calcium: 9.1 mg/dL (ref 8.4–10.5)
Creatinine, Ser: 0.92 mg/dL (ref 0.40–1.50)
GFR: 90.04 mL/min (ref >60.00)
GLUCOSE: 92 mg/dL (ref 70–99)
POTASSIUM: 3.8 meq/L (ref 3.5–5.1)
SODIUM: 136 meq/L (ref 135–145)

## 2015-04-05 LAB — LIPID PANEL
CHOLESTEROL: 167 mg/dL (ref 0–200)
HDL: 51 mg/dL (ref >39.00)
LDL Cholesterol: 102 mg/dL — ABNORMAL HIGH (ref 0–99)
NonHDL: 116
TRIGLYCERIDES: 68 mg/dL (ref 0.0–149.0)
Total CHOL/HDL Ratio: 3
VLDL: 13.6 mg/dL (ref 0.0–40.0)

## 2015-04-07 ENCOUNTER — Telehealth: Payer: Self-pay

## 2015-04-07 NOTE — Telephone Encounter (Signed)
Received insurance form from Pt at CPE on 04/04/2015. Form completed and faxed to [employer]. Copies made and sent for scanning into chart. Original placed in mail to send back to Pt.

## 2015-04-07 NOTE — Telephone Encounter (Signed)
Received fax confirmation on 04/07/2015 at 0913.

## 2015-04-11 ENCOUNTER — Encounter: Payer: Self-pay | Admitting: Gastroenterology

## 2015-05-29 IMAGING — CR DG FOOT COMPLETE 3+V*R*
3 series · 3 of 3 positions shown · non-contrast
Comparison: None.

CLINICAL DATA: Right foot pain post injury great toe laceration

EXAM:
RIGHT FOOT COMPLETE - 3+ VIEW

[t foot ap right]
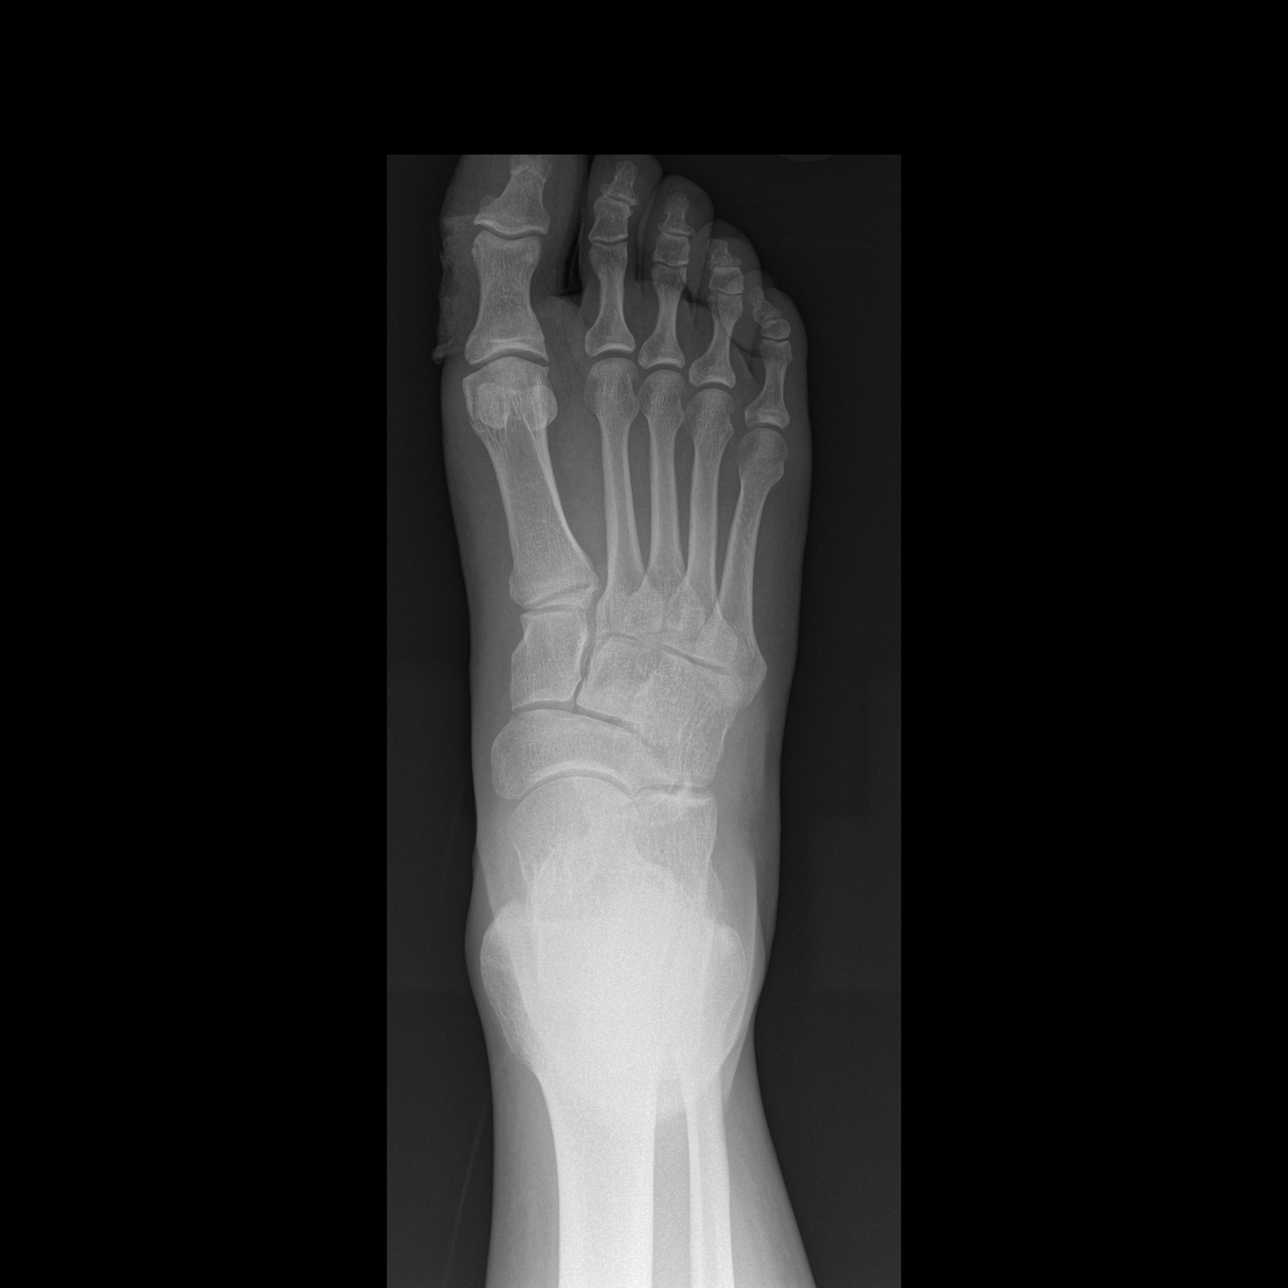

[t foot oblique right]
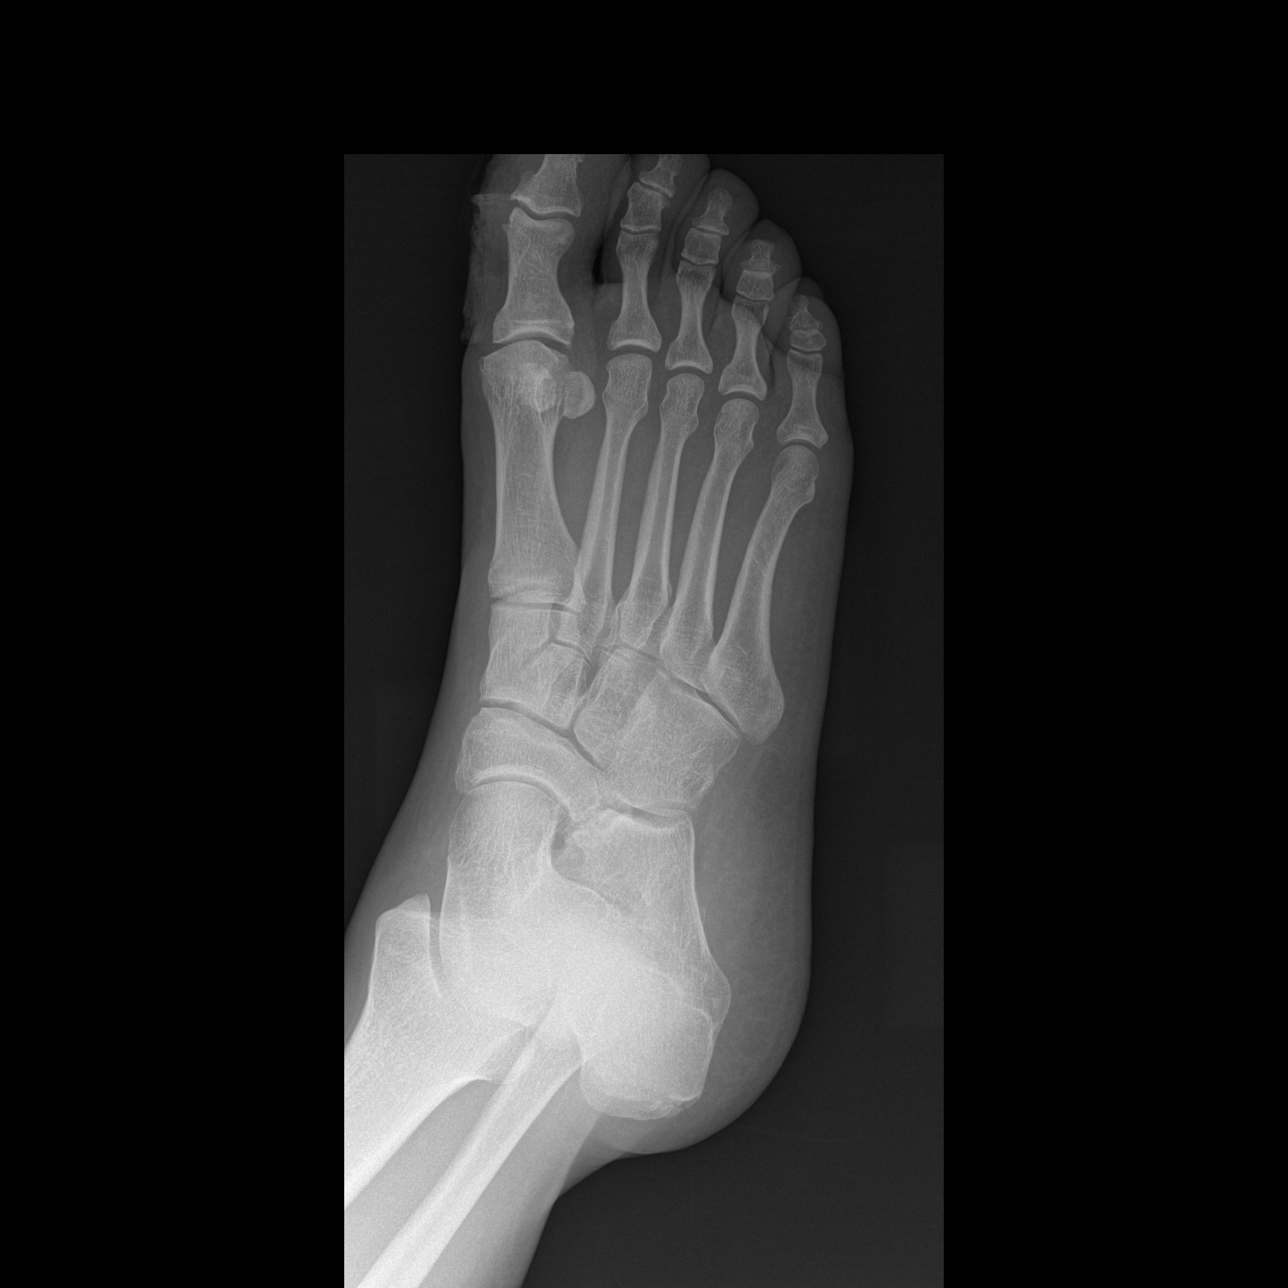

[t foot lat right]
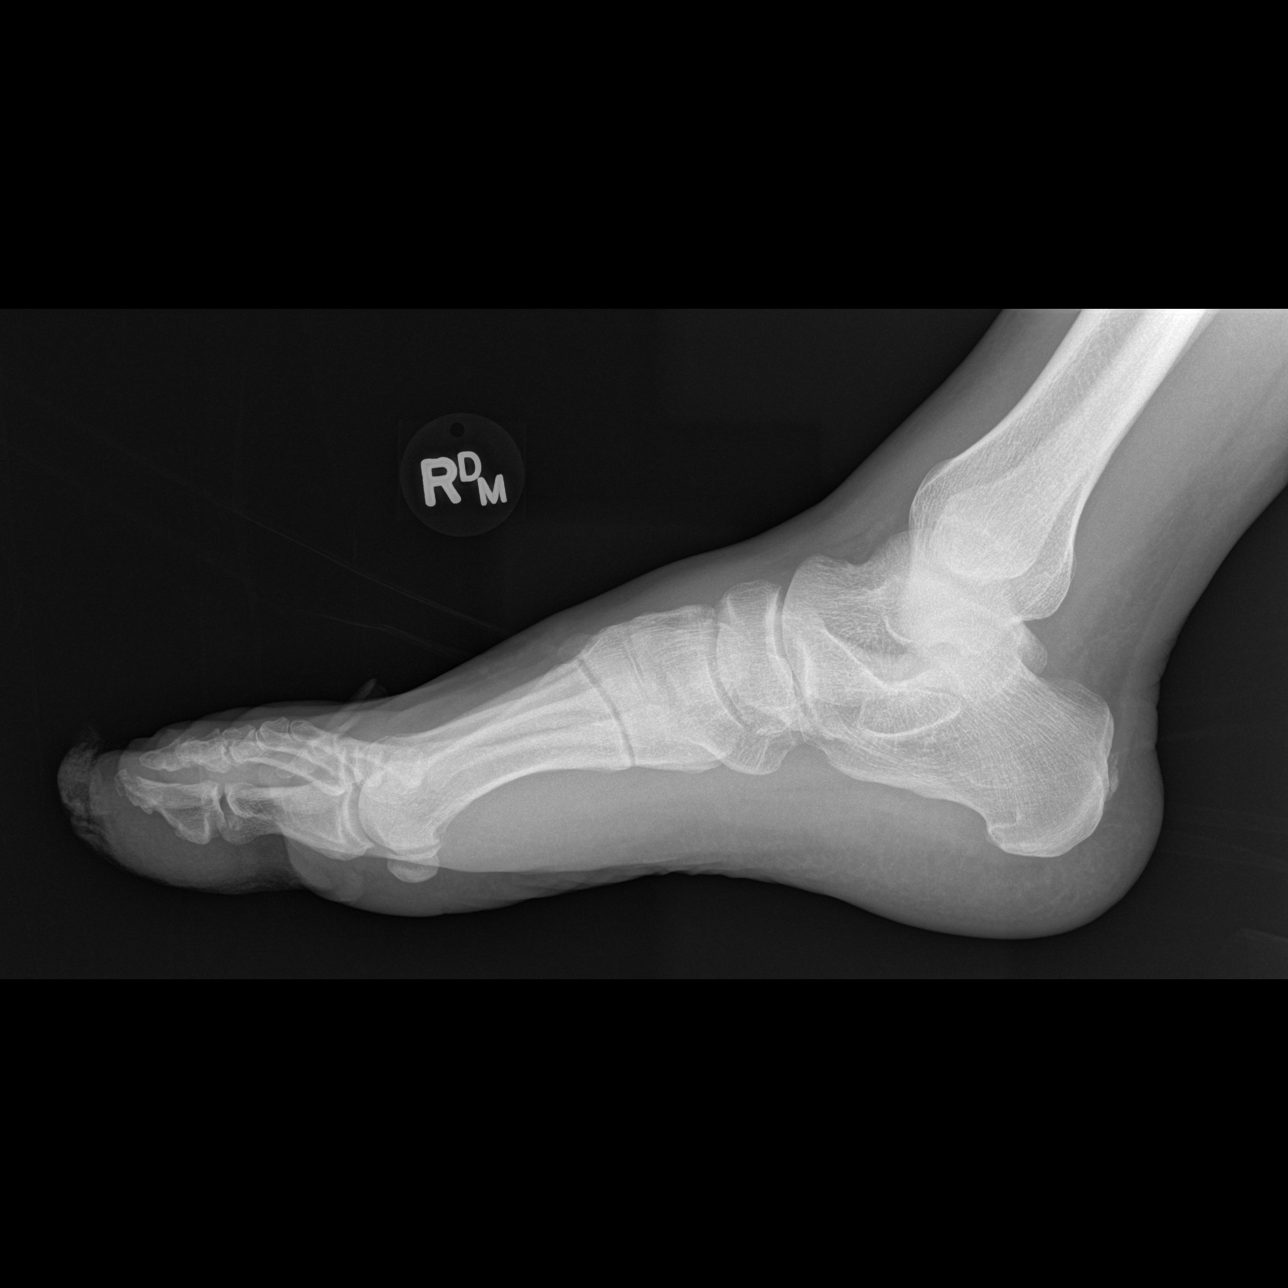

[3 of 3 positions shown; findings below may reference images not displayed]

FINDINGS: Three views of the right foot submitted. Study is limited by bandage
artifact. There is mild displaced fracture at the tip of distal
phalanx great toe. Probable soft tissue injury.
IMPRESSION: Mild displaced fracture at the tip of distal phalanx great toe.

## 2015-10-21 ENCOUNTER — Other Ambulatory Visit: Payer: Self-pay | Admitting: Internal Medicine

## 2015-10-25 ENCOUNTER — Other Ambulatory Visit: Payer: Self-pay

## 2015-10-25 MED ORDER — PANTOPRAZOLE SODIUM 40 MG PO TBEC: 40.0000 mg | DELAYED_RELEASE_TABLET | Freq: Every day | ORAL | Status: DC

## 2016-02-06 ENCOUNTER — Telehealth: Payer: 59 | Admitting: Family

## 2016-02-06 DIAGNOSIS — B9689 Other specified bacterial agents as the cause of diseases classified elsewhere: Secondary | ICD-10-CM

## 2016-02-06 DIAGNOSIS — J019 Acute sinusitis, unspecified: Secondary | ICD-10-CM

## 2016-02-06 MED ORDER — AMOXICILLIN-POT CLAVULANATE 875-125 MG PO TABS: 1.0000 | ORAL_TABLET | Freq: Two times a day (BID) | ORAL | Status: DC

## 2016-02-06 NOTE — Progress Notes (Signed)

## 2016-04-04 ENCOUNTER — Telehealth: Payer: Self-pay | Admitting: *Deleted

## 2016-04-04 ENCOUNTER — Encounter: Payer: Self-pay | Admitting: *Deleted

## 2016-04-04 NOTE — Telephone Encounter (Signed)
Pre-Visit Call completed with patient and chart updated.   Pre-Visit Info documented in Specialty Comments under [employer].    

## 2016-04-05 ENCOUNTER — Encounter: Payer: Self-pay | Admitting: Internal Medicine

## 2016-04-05 ENCOUNTER — Telehealth: Payer: Self-pay

## 2016-04-05 ENCOUNTER — Ambulatory Visit (INDEPENDENT_AMBULATORY_CARE_PROVIDER_SITE_OTHER): Payer: 59 | Admitting: Internal Medicine

## 2016-04-05 VITALS — BP 132/78 | HR 74 | Temp 98.3°F | Ht 71.0 in | Wt 214.2 lb

## 2016-04-05 DIAGNOSIS — Z114 Encounter for screening for human immunodeficiency virus [HIV]: Secondary | ICD-10-CM

## 2016-04-05 DIAGNOSIS — Z125 Encounter for screening for malignant neoplasm of prostate: Secondary | ICD-10-CM | POA: Diagnosis not present

## 2016-04-05 DIAGNOSIS — Z0000 Encounter for general adult medical examination without abnormal findings: Secondary | ICD-10-CM | POA: Diagnosis not present

## 2016-04-05 DIAGNOSIS — R739 Hyperglycemia, unspecified: Secondary | ICD-10-CM | POA: Diagnosis not present

## 2016-04-05 DIAGNOSIS — Z09 Encounter for follow-up examination after completed treatment for conditions other than malignant neoplasm: Secondary | ICD-10-CM | POA: Insufficient documentation

## 2016-04-05 LAB — COMPREHENSIVE METABOLIC PANEL
ALBUMIN: 4.4 g/dL (ref 3.5–5.2)
ALT: 20 U/L (ref 0–53)
AST: 14 U/L (ref 0–37)
Alkaline Phosphatase: 58 U/L (ref 39–117)
BILIRUBIN TOTAL: 1.3 mg/dL — AB (ref 0.2–1.2)
BUN: 13 mg/dL (ref 6–23)
CHLORIDE: 104 meq/L (ref 96–112)
CO2: 28 mEq/L (ref 19–32)
CREATININE: 1 mg/dL (ref 0.40–1.50)
Calcium: 9.5 mg/dL (ref 8.4–10.5)
GFR: 81.49 mL/min (ref >60.00)
Glucose, Bld: 94 mg/dL (ref 70–99)
Potassium: 4.6 mEq/L (ref 3.5–5.1)
SODIUM: 140 meq/L (ref 135–145)
TOTAL PROTEIN: 7.1 g/dL (ref 6.0–8.3)

## 2016-04-05 LAB — LIPID PANEL
CHOLESTEROL: 197 mg/dL (ref 0–200)
HDL: 46 mg/dL (ref >39.00)
LDL Cholesterol: 130 mg/dL — ABNORMAL HIGH (ref 0–99)
NonHDL: 150.76
Total CHOL/HDL Ratio: 4
Triglycerides: 106 mg/dL (ref 0.0–149.0)
VLDL: 21.2 mg/dL (ref 0.0–40.0)

## 2016-04-05 LAB — HIV ANTIBODY (ROUTINE TESTING W REFLEX): HIV: NONREACTIVE

## 2016-04-05 LAB — PSA: PSA: 1.19 ng/mL (ref 0.10–4.00)

## 2016-04-05 LAB — HEMOGLOBIN A1C: HEMOGLOBIN A1C: 5.6 % (ref 4.6–6.5)

## 2016-04-05 NOTE — Telephone Encounter (Signed)
Received fax confirmation on 04/05/2016 at 1426.

## 2016-04-05 NOTE — Telephone Encounter (Signed)
Form completed and faxed to [employer] at [phone], original mailed back to Pt and copy sent for scanning.

## 2016-04-05 NOTE — Assessment & Plan Note (Signed)
Td 12- 2008 CCS: scope 9-09 neg except for hemorhhoids , no FH, next cscope 2019   DRE   Normal today, check a PSA  Diet-exercise discussed  Labs : CMP, FLP, A1c, PSA, HIV

## 2016-04-05 NOTE — Patient Instructions (Signed)
GO TO THE LAB : Get the blood work     GO TO THE FRONT DESK Schedule your next appointment for a  complete physical exam, one year, fasting    Check the  blood pressure 2  times a month  Be sure your blood pressure is between 110/65 and  135/85. If it is consistently higher or lower, let me know    If you need more information about a healthy diet,   hypertension visit: The American Heart Association, http://www.heart.org  The American diabetes Association  Http://www.diabetes.org

## 2016-04-05 NOTE — Progress Notes (Signed)
Subjective:    Patient ID: Jacob Gould, male    DOB: Apr 19, 1956, 60 y.o.   MRN: [id number]  DOS:  04/05/2016 Type of visit - description : CPX Interval history: Doing well, no major concerns  Review of Systems  Constitutional: No fever. No chills. No unexplained wt changes. No unusual sweats  HEENT: No dental problems, no ear discharge, no facial swelling, no voice changes. No eye discharge, no eye  redness , no  intolerance to light   Respiratory: No wheezing , no  difficulty breathing. No cough , no mucus production  Cardiovascular: No CP, no leg swelling , no  Palpitations  GI: no nausea, no vomiting, no diarrhea , no  abdominal pain.  No blood in the stools. No dysphagia, no odynophagia    Endocrine: No polyphagia, no polyuria , no polydipsia  GU: No dysuria, gross hematuria, difficulty urinating. No urinary urgency, no frequency.  Musculoskeletal: No joint swellings or unusual aches or pains  Skin: No change in the color of the skin, palor , no  Rash  Allergic, immunologic: No environmental allergies , no  food allergies  Neurological: No dizziness no  syncope. No headaches. No diplopia, no slurred, no slurred speech, no motor deficits, no facial  Numbness  Hematological: No enlarged lymph nodes, no easy bruising , no unusual bleedings  Psychiatry: No suicidal ideas, no hallucinations, no beavior problems, no confusion.  No unusual/severe anxiety, no depression   Past Medical History  Diagnosis Date  . Hypertension   . Allergic rhinitis   . Low back pain     intermittent  . GERD (gastroesophageal reflux disease)     Past Surgical History  Procedure Laterality Date  . Toe injury  2015    R great toe, saw ortho    Social History   Social History  . Marital Status: Married    Spouse Name: N/A  . Number of Children: 3  . Years of Education: N/A   Occupational History  . Building control surveyor     Social History Main Topics  . Smoking status: Never  Smoker   . Smokeless tobacco: Former Systems developer     Comment: quit in the 90s  . Alcohol Use: Yes     Comment: wine at night   . Drug Use: No  . Sexual Activity: Not on file   Other Topics Concern  . Not on file   Social History Narrative   3 adult children, 3 G-kids      Family History  Problem Relation Age of Onset  . Diabetes Neg Hx   . Colon cancer Neg Hx   . Prostate cancer Neg Hx   . Heart attack Neg Hx   . Hypertension Mother     also GF      Medication List       This list is accurate as of: 04/05/16  5:06 PM.  Always use your most recent med list.               amLODipine 10 MG tablet  Commonly known as:  NORVASC  Take 1 tablet (10 mg total) by mouth daily.     fluticasone 50 MCG/ACT nasal spray  Commonly known as:  FLONASE  USE TWO SPRAYS IN EACH NOSTRIL DAILY AT BEDTIME     MULTIVITAMIN ADULT PO  Take 1 tablet by mouth daily as needed.     pantoprazole 40 MG tablet  Commonly known as:  PROTONIX  Take 1 tablet (40  mg total) by mouth daily.           Objective:   Physical Exam BP 132/78 mmHg  Pulse 74  Temp(Src) 98.3 F (36.8 C) (Oral)  Ht 5\' 11"  (1.803 m)  Wt 214 lb 4 oz (97.183 kg)  BMI 29.90 kg/m2  SpO2 98%  General:   Well developed, well nourished . NAD.  Neck: No  thyromegaly   HEENT:  Normocephalic . Face symmetric, atraumatic Lungs:  CTA B Normal respiratory effort, no intercostal retractions, no accessory muscle use. Heart: RRR,  no murmur.  No pretibial edema bilaterally  Abdomen:  Not distended, soft, non-tender. No rebound or rigidity.   Skin: Exposed areas without rash. Not pale. Not jaundice Rectal:  External abnormalities: none. Normal sphincter tone. No rectal masses or tenderness.  No stools found Prostate: Prostate gland firm and smooth, no enlargement, nodularity, tenderness, mass, asymmetry or induration.  Neurologic:  alert & oriented X3.  Speech normal, gait appropriate for age and unassisted Strength  symmetric and appropriate for age.  Psych: Cognition and judgment appear intact.  Cooperative with normal attention span and concentration.  Behavior appropriate. No anxious or depressed appearing.    Assessment & Plan:   Assessment Hyperglycemia  A1c 5.7 (2012) HTN GERD -- PPIs prn Occasional back pain Seasonal allergies  Plan: HTN: Continue amlodipine, ambulatory BPs excellent ranging from 110-130. GERD: Changed his diet, hardly ever uses PPIs RTC 1 year

## 2016-04-05 NOTE — Assessment & Plan Note (Signed)
HTN: Continue amlodipine, ambulatory BPs excellent ranging from 110-130. GERD: Changed his diet, hardly ever uses PPIs RTC 1 year

## 2016-04-05 NOTE — Progress Notes (Signed)
Pre visit review using our clinic review tool, if applicable. No additional management support is needed unless otherwise documented below in the visit note. 

## 2016-05-05 ENCOUNTER — Other Ambulatory Visit: Payer: Self-pay | Admitting: Internal Medicine

## 2017-03-15 ENCOUNTER — Other Ambulatory Visit: Payer: Self-pay | Admitting: Internal Medicine

## 2017-04-07 ENCOUNTER — Encounter: Payer: Self-pay | Admitting: Internal Medicine

## 2017-04-07 ENCOUNTER — Telehealth: Payer: Self-pay

## 2017-04-07 ENCOUNTER — Ambulatory Visit (INDEPENDENT_AMBULATORY_CARE_PROVIDER_SITE_OTHER): Payer: 59 | Admitting: Internal Medicine

## 2017-04-07 VITALS — BP 122/78 | HR 71 | Temp 98.1°F | Resp 14 | Ht 71.0 in | Wt 214.5 lb

## 2017-04-07 DIAGNOSIS — Z0000 Encounter for general adult medical examination without abnormal findings: Secondary | ICD-10-CM

## 2017-04-07 DIAGNOSIS — Z23 Encounter for immunization: Secondary | ICD-10-CM | POA: Diagnosis not present

## 2017-04-07 DIAGNOSIS — Z1159 Encounter for screening for other viral diseases: Secondary | ICD-10-CM

## 2017-04-07 LAB — LIPID PANEL
CHOL/HDL RATIO: 4
Cholesterol: 170 mg/dL (ref 0–200)
HDL: 46.7 mg/dL (ref >39.00)
LDL CALC: 101 mg/dL — AB (ref 0–99)
NonHDL: 122.96
Triglycerides: 111 mg/dL (ref 0.0–149.0)
VLDL: 22.2 mg/dL (ref 0.0–40.0)

## 2017-04-07 LAB — CBC WITH DIFFERENTIAL/PLATELET
Basophils Absolute: 0 10*3/uL (ref 0.0–0.1)
Basophils Relative: 0.3 % (ref 0.0–3.0)
EOS PCT: 2.8 % (ref 0.0–5.0)
Eosinophils Absolute: 0.1 10*3/uL (ref 0.0–0.7)
HCT: 47 % (ref 39.0–52.0)
Hemoglobin: 16.3 g/dL (ref 13.0–17.0)
LYMPHS ABS: 1.6 10*3/uL (ref 0.7–4.0)
Lymphocytes Relative: 33.2 % (ref 12.0–46.0)
MCHC: 34.6 g/dL (ref 30.0–36.0)
MCV: 89.5 fl (ref 78.0–100.0)
Monocytes Absolute: 0.4 10*3/uL (ref 0.1–1.0)
Monocytes Relative: 8.4 % (ref 3.0–12.0)
NEUTROS PCT: 55.3 % (ref 43.0–77.0)
Neutro Abs: 2.6 10*3/uL (ref 1.4–7.7)
Platelets: 198 10*3/uL (ref 150.0–400.0)
RBC: 5.25 Mil/uL (ref 4.22–5.81)
RDW: 13.2 % (ref 11.5–15.5)
WBC: 4.7 10*3/uL (ref 4.0–10.5)

## 2017-04-07 LAB — COMPREHENSIVE METABOLIC PANEL
ALT: 17 U/L (ref 0–53)
AST: 16 U/L (ref 0–37)
Albumin: 4.4 g/dL (ref 3.5–5.2)
Alkaline Phosphatase: 54 U/L (ref 39–117)
BUN: 14 mg/dL (ref 6–23)
CHLORIDE: 106 meq/L (ref 96–112)
CO2: 24 mEq/L (ref 19–32)
CREATININE: 0.92 mg/dL (ref 0.40–1.50)
Calcium: 9.3 mg/dL (ref 8.4–10.5)
GFR: 89.41 mL/min (ref >60.00)
Glucose, Bld: 106 mg/dL — ABNORMAL HIGH (ref 70–99)
Potassium: 4 mEq/L (ref 3.5–5.1)
SODIUM: 137 meq/L (ref 135–145)
Total Bilirubin: 1.6 mg/dL — ABNORMAL HIGH (ref 0.2–1.2)
Total Protein: 7.5 g/dL (ref 6.0–8.3)

## 2017-04-07 LAB — HEMOGLOBIN A1C: HEMOGLOBIN A1C: 5.5 % (ref 4.6–6.5)

## 2017-04-07 MED ORDER — HYDROCORTISONE 2.5 % EX CREA: TOPICAL_CREAM | Freq: Two times a day (BID) | CUTANEOUS | 0 refills | Status: AC

## 2017-04-07 MED ORDER — KETOCONAZOLE 2 % EX CREA: 1.0000 "application " | TOPICAL_CREAM | Freq: Every day | CUTANEOUS | 0 refills | Status: DC

## 2017-04-07 NOTE — Progress Notes (Signed)
Subjective:    Patient ID: Jacob Gould, male    DOB: 08-19-1956, 61 y.o.   MRN: [id number]  DOS:  04/07/2017 Type of visit - description : cpx Interval history:  Doing well, he remains extremely active  Review of Systems Noted a rash about 4 weeks ago and the right leg, using OTC eczema cream without much help. While he uses the cream area doesn't itch. No tick bites in the area that he can tell.   Other than above, a 14 point review of systems is negative     Past Medical History:  Diagnosis Date  . Allergic rhinitis   . GERD (gastroesophageal reflux disease)   . Hypertension   . Low back pain    intermittent    Past Surgical History:  Procedure Laterality Date  . toe injury  2015   R great toe, saw ortho    Social History   Social History  . Marital status: Married    Spouse name: N/A  . Number of children: 3  . Years of education: N/A   Occupational History  . Building control surveyor     Social History Main Topics  . Smoking status: Never Smoker  . Smokeless tobacco: Former Systems developer     Comment: quit in the 90s  . Alcohol use Yes     Comment: wine at night   . Drug use: No  . Sexual activity: Not on file   Other Topics Concern  . Not on file   Social History Narrative   3 adult children, 3 G-kids      Family History  Problem Relation Age of Onset  . Hypertension Mother        also GF  . Diabetes Neg Hx   . Colon cancer Neg Hx   . Prostate cancer Neg Hx   . Heart attack Neg Hx      Allergies as of 04/07/2017   No Known Allergies     Medication List       Accurate as of 04/07/17 11:59 PM. Always use your most recent med list.          amLODipine 10 MG tablet Commonly known as:  NORVASC Take 1 tablet (10 mg total) by mouth daily.   fluticasone 50 MCG/ACT nasal spray Commonly known as:  FLONASE USE TWO SPRAYS IN EACH NOSTRIL DAILY AT BEDTIME   hydrocortisone 2.5 % cream Apply topically 2 (two) times daily.   ketoconazole 2 %  cream Commonly known as:  NIZORAL Apply 1 application topically daily.   MULTIVITAMIN ADULT PO Take 1 tablet by mouth daily as needed.   pantoprazole 40 MG tablet Commonly known as:  PROTONIX Take 1 tablet (40 mg total) by mouth daily.          Objective:   Physical Exam BP 122/78 (BP Location: Left Arm, Patient Position: Sitting, Cuff Size: Normal)   Pulse 71   Temp 98.1 F (36.7 C) (Oral)   Resp 14   Ht 5\' 11"  (1.803 m)   Wt 214 lb 8 oz (97.3 kg)   SpO2 98%   BMI 29.92 kg/m   General:   Well developed, well nourished . NAD.  Neck: No  thyromegaly  HEENT:  Normocephalic . Face symmetric, atraumatic Lungs:  CTA B Normal respiratory effort, no intercostal retractions, no accessory muscle use. Heart: RRR,  no murmur.  No pretibial edema bilaterally  Abdomen:  Not distended, soft, non-tender. No rebound or rigidity.   Skin:  Lateral aspect of the right pretibial area with few maculopapular spots, 2 or 3 mm in size, slightly red and scaly. Few of them are arranged in an annular fashion. Neurologic:  alert & oriented X3.  Speech normal, gait appropriate for age and unassisted Strength symmetric and appropriate for age.  Psych: Cognition and judgment appear intact.  Cooperative with normal attention span and concentration.  Behavior appropriate. No anxious or depressed appearing.    Assessment & Plan:   Assessment Hyperglycemia  A1c 5.7 (2012) HTN GERD -- PPIs prn Occasional back pain Seasonal allergies  Plan: Hyperglycemia: Check A1c HTN: BPs are checked regularly, ~ 120/80 Rash: Fungal? Rx Nizoral and hydrocortisone. RTC one year

## 2017-04-07 NOTE — Patient Instructions (Signed)
GO TO THE LAB : Get the blood work     GO TO THE FRONT DESK Schedule your next appointment for a  physical exam in one year   Use both creams twice a day for 10 days, call if not gradually improving

## 2017-04-07 NOTE — Progress Notes (Signed)
Pre visit review using our clinic review tool, if applicable. No additional management support is needed unless otherwise documented below in the visit note. 

## 2017-04-07 NOTE — Assessment & Plan Note (Signed)
--  Td 2018 --CCS: scope 9-09 neg except for hemorhhoids , no FH, next cscope 2019   --DRE  PSA wnl 2017 --Diet-exercise discussed . He is doing very well, he hike the [place] trail walking 12 miles a day for few days. --Labs :  CMP, FLP, CBC, A1c, hep C

## 2017-04-07 NOTE — Telephone Encounter (Signed)
Physician Results Form: [facility] completed and faxed to [phone]. Form sent for scanning.

## 2017-04-08 LAB — HEPATITIS C ANTIBODY: HCV Ab: NEGATIVE

## 2017-04-08 NOTE — Assessment & Plan Note (Signed)
Hyperglycemia: Check A1c HTN: BPs are checked regularly, ~ 120/80 Rash: Fungal? Rx Nizoral and hydrocortisone. RTC one year

## 2017-04-14 ENCOUNTER — Other Ambulatory Visit: Payer: Self-pay

## 2017-04-14 MED ORDER — AMLODIPINE BESYLATE 10 MG PO TABS
10.0000 mg | ORAL_TABLET | Freq: Every day | ORAL | 3 refills | Status: DC
Start: 2017-04-14 — End: 2018-04-18

## 2017-05-09 ENCOUNTER — Other Ambulatory Visit: Payer: Self-pay | Admitting: Internal Medicine

## 2017-08-02 ENCOUNTER — Other Ambulatory Visit: Payer: Self-pay | Admitting: Internal Medicine

## 2017-08-09 DIAGNOSIS — Z23 Encounter for immunization: Secondary | ICD-10-CM | POA: Diagnosis not present

## 2017-10-07 ENCOUNTER — Telehealth: Payer: 59 | Admitting: Family

## 2017-10-07 DIAGNOSIS — B9789 Other viral agents as the cause of diseases classified elsewhere: Secondary | ICD-10-CM

## 2017-10-07 DIAGNOSIS — J019 Acute sinusitis, unspecified: Secondary | ICD-10-CM

## 2017-10-07 MED ORDER — FLUTICASONE PROPIONATE 50 MCG/ACT NA SUSP: 2.0000 | Freq: Every day | NASAL | 0 refills | Status: DC

## 2017-10-07 NOTE — Progress Notes (Signed)

## 2017-12-12 ENCOUNTER — Other Ambulatory Visit: Payer: Self-pay | Admitting: Internal Medicine

## 2018-01-20 ENCOUNTER — Encounter: Payer: Self-pay | Admitting: Internal Medicine

## 2018-01-22 ENCOUNTER — Ambulatory Visit: Payer: Self-pay

## 2018-01-22 NOTE — Telephone Encounter (Signed)
Noted, thx.

## 2018-01-22 NOTE — Telephone Encounter (Signed)
FYI

## 2018-01-22 NOTE — Telephone Encounter (Signed)
Pt. Called to schedule appointment for elevated BP readings.Has an appointment for tomorrow. BP readings this morning 162/83 pulse 100; 146/90 106 pulse; 170/83 103 pulse. Denies any headache or other symptoms. Instructed to call back if anything changes.

## 2018-01-23 ENCOUNTER — Encounter: Payer: Self-pay | Admitting: Medical

## 2018-01-23 ENCOUNTER — Ambulatory Visit (INDEPENDENT_AMBULATORY_CARE_PROVIDER_SITE_OTHER): Payer: 59 | Admitting: Medical

## 2018-01-23 ENCOUNTER — Ambulatory Visit: Payer: 59 | Admitting: Internal Medicine

## 2018-01-23 VITALS — BP 132/80 | HR 87 | Temp 98.1°F | Resp 16 | Ht 71.0 in | Wt 213.0 lb

## 2018-01-23 DIAGNOSIS — F439 Reaction to severe stress, unspecified: Secondary | ICD-10-CM | POA: Diagnosis not present

## 2018-01-23 DIAGNOSIS — I10 Essential (primary) hypertension: Secondary | ICD-10-CM

## 2018-01-23 NOTE — Progress Notes (Signed)
Subjective:    Patient ID: Jacob Gould, male    DOB: 1956-10-30, 62 y.o.   MRN: [id number]  HPI  Pt in for evaluation.   Pt bp machine correlates with our levels. Pt states he feels anxiety about lay offs at work and maybe checking bp is stressing him out as well  Systolic ranging high 277'O to 170. Diastolics. 80-90  January his bp was much higher.  No gross motor or sensory function deficits. Pt is exercising regularly.  Pt is on amlodipine.  Pt does drink 1-2 cups of coffee a day. He avoid salt as well.   Review of Systems  Constitutional: Negative for chills, fatigue and fever.  Respiratory: Negative for apnea, cough, chest tightness, shortness of breath and wheezing.   Cardiovascular: Negative for chest pain and palpitations.  Gastrointestinal: Negative for abdominal pain.  Musculoskeletal: Negative for back pain, gait problem, joint swelling and neck pain.  Skin: Negative for rash.  Neurological: Negative for dizziness, seizures, speech difficulty, weakness, light-headedness and headaches.  Hematological: Negative for adenopathy. Does not bruise/bleed easily.  Psychiatric/Behavioral: Negative for agitation and dysphoric mood. The patient is not nervous/anxious.        Some stress related to potential job loss.     Past Medical History:  Diagnosis Date  . Allergic rhinitis   . GERD (gastroesophageal reflux disease)   . Hypertension   . Low back pain    intermittent     Social History   Socioeconomic History  . Marital status: Married    Spouse name: Not on file  . Number of children: 3  . Years of education: Not on file  . Highest education level: Not on file  Social Needs  . Financial resource strain: Not on file  . Food insecurity - worry: Not on file  . Food insecurity - inability: Not on file  . Transportation needs - medical: Not on file  . Transportation needs - non-medical: Not on file  Occupational History  . Occupation: Building control surveyor     Tobacco Use  . Smoking status: Never Smoker  . Smokeless tobacco: Former Systems developer  . Tobacco comment: quit in the 90s  Substance and Sexual Activity  . Alcohol use: Yes    Comment: wine at night   . Drug use: No  . Sexual activity: Not on file  Other Topics Concern  . Not on file  Social History Narrative   3 adult children, 3 G-kids     Past Surgical History:  Procedure Laterality Date  . toe injury  2015   R great toe, saw ortho    Family History  Problem Relation Age of Onset  . Hypertension Mother        also GF  . Diabetes Neg Hx   . Colon cancer Neg Hx   . Prostate cancer Neg Hx   . Heart attack Neg Hx     No Known Allergies  Current Outpatient Medications on File Prior to Visit  Medication Sig Dispense Refill  . amLODipine (NORVASC) 10 MG tablet Take 1 tablet (10 mg total) by mouth daily. 90 tablet 3  . fluticasone (FLONASE) 50 MCG/ACT nasal spray Place 2 sprays daily into both nostrils. 16 g 0  . hydrocortisone 2.5 % cream Apply topically 2 (two) times daily. 30 g 0  . ketoconazole (NIZORAL) 2 % cream Apply topically daily. 30 g 0  . Multiple Vitamins-Minerals (MULTIVITAMIN ADULT PO) Take 1 tablet by mouth daily as needed.  No current facility-administered medications on file prior to visit.     BP 132/80   Pulse 87   Temp 98.1 F (36.7 C) (Oral)   Resp 16   Ht 5\' 11"  (1.803 m)   Wt 213 lb (96.6 kg)   SpO2 98%   BMI 29.71 kg/m       Objective:   Physical Exam  General Mental Status- Alert. General Appearance- Not in acute distress.   Skin General: Color- Normal Color. Moisture- Normal Moisture.  Neck Carotid Arteries- Normal color. Moisture- Normal Moisture. No carotid bruits. No JVD.  Chest and Lung Exam Auscultation: Breath Sounds:-Normal.  Cardiovascular Auscultation:Rythm- Regular. Murmurs & Other Heart Sounds:Auscultation of the heart reveals- No Murmurs.  Abdomen Inspection:-Inspeection Normal. Palpation/Percussion:Note:No  mass. Palpation and Percussion of the abdomen reveal- Non Tender, Non Distended + BS, no rebound or guarding.   Neurologic Cranial Nerve exam:- CN III-XII intact(No nystagmus), symmetric smile. Strength:- 5/5 equal and symmetric strength both upper and lower extremities.      Assessment & Plan:  You blood pressure is actually very good today when I checked.  Also good when medical assistant checked and with your machine.  Your blood pressure readings have varied at home and medical assistant reviewed with the technique on how to check your blood pressure.  I would recommend that you check your blood pressure daily over the next 7 days and send [place] a my chart message on the readings.  Also stress and anxiety could play a role in your blood pressure levels.  I did offer you some medication in the event that you felt extremely anxious.  Please monitor how you feel over the next week and let us know.  We could send prescription to your pharmacy.  Follow-up in 7 days by my chart would be fine.  Office visit available as well if you need.  Mackie Pai, PA-C

## 2018-01-23 NOTE — Patient Instructions (Signed)
You blood pressure is actually very good today when I checked.  Also good when medical assistant checked and with your machine.  Your blood pressure readings have varied at home and medical assistant reviewed with the technique on how to check your blood pressure.  I would recommend that you check your blood pressure daily over the next 7 days and send [place] a my chart message on the readings.  Also stress and anxiety could play a role in your blood pressure levels.  I did offer you some medication in the event that you felt extremely anxious.  Please monitor how you feel over the next week and let us know.  We could send prescription to your pharmacy.  Follow-up in 7 days by my chart would be fine.  Office visit available as well if you need.

## 2018-02-17 ENCOUNTER — Telehealth: Payer: Self-pay | Admitting: Internal Medicine

## 2018-02-17 NOTE — Telephone Encounter (Signed)
Copied from [place] [id number]. Topic: Quick Communication - See Telephone Encounter >> Feb 17, 2018  4:18 PM Genella Rife H wrote: CRM for notification. See Telephone encounter for: 02/17/18.  Left vm 04/08/18 appt needs to be rescheduled per pcp

## 2018-04-08 ENCOUNTER — Encounter: Payer: 59 | Admitting: Internal Medicine

## 2018-04-18 ENCOUNTER — Other Ambulatory Visit: Payer: Self-pay | Admitting: Internal Medicine

## 2018-05-06 ENCOUNTER — Telehealth: Payer: Self-pay

## 2018-05-06 ENCOUNTER — Ambulatory Visit (INDEPENDENT_AMBULATORY_CARE_PROVIDER_SITE_OTHER): Payer: 59 | Admitting: Internal Medicine

## 2018-05-06 ENCOUNTER — Encounter: Payer: Self-pay | Admitting: Internal Medicine

## 2018-05-06 VITALS — BP 122/70 | HR 65 | Temp 97.8°F | Resp 16 | Ht 71.0 in | Wt 195.0 lb

## 2018-05-06 DIAGNOSIS — Z0000 Encounter for general adult medical examination without abnormal findings: Secondary | ICD-10-CM | POA: Diagnosis not present

## 2018-05-06 LAB — CBC WITH DIFFERENTIAL/PLATELET
BASOS ABS: 0 10*3/uL (ref 0.0–0.1)
Basophils Relative: 0.4 % (ref 0.0–3.0)
Eosinophils Absolute: 0.1 10*3/uL (ref 0.0–0.7)
Eosinophils Relative: 2.4 % (ref 0.0–5.0)
HCT: 47.2 % (ref 39.0–52.0)
Hemoglobin: 16 g/dL (ref 13.0–17.0)
LYMPHS PCT: 29.1 % (ref 12.0–46.0)
Lymphs Abs: 1.4 10*3/uL (ref 0.7–4.0)
MCHC: 33.9 g/dL (ref 30.0–36.0)
MCV: 91.6 fl (ref 78.0–100.0)
Monocytes Absolute: 0.4 10*3/uL (ref 0.1–1.0)
Monocytes Relative: 7.8 % (ref 3.0–12.0)
Neutro Abs: 2.8 10*3/uL (ref 1.4–7.7)
Neutrophils Relative %: 60.3 % (ref 43.0–77.0)
Platelets: 208 10*3/uL (ref 150.0–400.0)
RBC: 5.15 Mil/uL (ref 4.22–5.81)
RDW: 13.3 % (ref 11.5–15.5)
WBC: 4.7 10*3/uL (ref 4.0–10.5)

## 2018-05-06 LAB — COMPREHENSIVE METABOLIC PANEL
ALT: 13 U/L (ref 0–53)
AST: 15 U/L (ref 0–37)
Albumin: 4.5 g/dL (ref 3.5–5.2)
Alkaline Phosphatase: 57 U/L (ref 39–117)
BUN: 11 mg/dL (ref 6–23)
CALCIUM: 9.7 mg/dL (ref 8.4–10.5)
CO2: 30 meq/L (ref 19–32)
CREATININE: 1.02 mg/dL (ref 0.40–1.50)
Chloride: 104 mEq/L (ref 96–112)
GFR: 79.09 mL/min (ref >60.00)
GLUCOSE: 97 mg/dL (ref 70–99)
Potassium: 5.1 mEq/L (ref 3.5–5.1)
SODIUM: 140 meq/L (ref 135–145)
Total Bilirubin: 1.4 mg/dL — ABNORMAL HIGH (ref 0.2–1.2)
Total Protein: 7.1 g/dL (ref 6.0–8.3)

## 2018-05-06 LAB — LIPID PANEL
CHOL/HDL RATIO: 4
Cholesterol: 168 mg/dL (ref 0–200)
HDL: 46.1 mg/dL (ref >39.00)
LDL CALC: 109 mg/dL — AB (ref 0–99)
NonHDL: 121.92
Triglycerides: 63 mg/dL (ref 0.0–149.0)
VLDL: 12.6 mg/dL (ref 0.0–40.0)

## 2018-05-06 LAB — TSH: TSH: 1.38 u[IU]/mL (ref 0.35–4.50)

## 2018-05-06 MED ORDER — AMLODIPINE BESYLATE 10 MG PO TABS: 10.0000 mg | ORAL_TABLET | Freq: Every day | ORAL | 3 refills | Status: DC

## 2018-05-06 NOTE — Assessment & Plan Note (Signed)
Hyperglycemia: A1c stable for years, recheck in  2020 HTN: BP was a slightly elevated few months ago, up to 170/90, patient is very active, he adjust his diet a little, ambulatory BPs are now normal. Cont amlodipine RTC 1 year

## 2018-05-06 NOTE — Telephone Encounter (Signed)
Physical form completed and faxed to [facility] at [phone]. Form sent for scanning.

## 2018-05-06 NOTE — Patient Instructions (Signed)
GO TO THE LAB : Get the blood work     GO TO THE FRONT DESK Schedule your next appointment for a  Physical exam in 1 year     Check the  blood pressure 2 or 3 times a month   Be sure your blood pressure is between 110/65 and  135/85. If it is consistently higher or lower, let me know    

## 2018-05-06 NOTE — Progress Notes (Signed)
Pre visit review using our clinic review tool, if applicable. No additional management support is needed unless otherwise documented below in the visit note. 

## 2018-05-06 NOTE — Progress Notes (Signed)
Subjective:    Patient ID: Jacob Gould, male    DOB: 1956/09/01, 62 y.o.   MRN: [id number]  DOS:  05/06/2018 Type of visit - description : cpx Interval history:  No concerns   Review of Systems  A 14 point review of systems is negative    Past Medical History:  Diagnosis Date  . Allergic rhinitis   . GERD (gastroesophageal reflux disease)   . Hypertension   . Low back pain    intermittent    Past Surgical History:  Procedure Laterality Date  . toe injury  2015   R great toe, saw ortho    Social History   Socioeconomic History  . Marital status: Married    Spouse name: Not on file  . Number of children: 3  . Years of education: Not on file  . Highest education level: Not on file  Occupational History  . Occupation: Building control surveyor   Social Needs  . Financial resource strain: Not on file  . Food insecurity:    Worry: Not on file    Inability: Not on file  . Transportation needs:    Medical: Not on file    Non-medical: Not on file  Tobacco Use  . Smoking status: Never Smoker  . Smokeless tobacco: Former Systems developer  . Tobacco comment: quit in the 90s  Substance and Sexual Activity  . Alcohol use: Yes    Comment: wine at night   . Drug use: No  . Sexual activity: Not on file  Lifestyle  . Physical activity:    Days per week: Not on file    Minutes per session: Not on file  . Stress: Not on file  Relationships  . Social connections:    Talks on phone: Not on file    Gets together: Not on file    Attends religious service: Not on file    Active member of club or organization: Not on file    Attends meetings of clubs or organizations: Not on file    Relationship status: Not on file  . Intimate partner violence:    Fear of current or ex partner: Not on file    Emotionally abused: Not on file    Physically abused: Not on file    Forced sexual activity: Not on file  Other Topics Concern  . Not on file  Social History Narrative   3 adult children, 3  G-kids      Family History  Problem Relation Age of Onset  . Hypertension Mother        also GF  . Diabetes Neg Hx   . Colon cancer Neg Hx   . Prostate cancer Neg Hx   . Heart attack Neg Hx      Allergies as of 05/06/2018   No Known Allergies     Medication List        Accurate as of 05/06/18 11:02 AM. Always use your most recent med list.          amLODipine 10 MG tablet Commonly known as:  NORVASC Take 1 tablet (10 mg total) by mouth daily.          Objective:   Physical Exam BP 122/70 (BP Location: Left Arm, Patient Position: Sitting, Cuff Size: Small)   Pulse 65   Temp 97.8 F (36.6 C) (Oral)   Resp 16   Ht 5\' 11"  (1.803 m)   Wt 195 lb (88.5 kg)   SpO2 98%  BMI 27.20 kg/m  General: Well developed, NAD, see BMI.  Neck: No  thyromegaly  HEENT:  Normocephalic . Face symmetric, atraumatic Lungs:  CTA B Normal respiratory effort, no intercostal retractions, no accessory muscle use. Heart: RRR,  no murmur.  No pretibial edema bilaterally  Abdomen:  Not distended, soft, non-tender. No rebound or rigidity.   Skin: Exposed areas without rash. Not pale. Not jaundice Neurologic:  alert & oriented X3.  Speech normal, gait appropriate for age and unassisted Strength symmetric and appropriate for age.  Psych: Cognition and judgment appear intact.  Cooperative with normal attention span and concentration.  Behavior appropriate. No anxious or depressed appearing.     Assessment & Plan:   Assessment Hyperglycemia  A1c 5.7 (2012) HTN GERD -- PPIs prn Occasional back pain Seasonal allergies  Plan: Hyperglycemia: A1c stable for years, recheck in  2020 HTN: BP was a slightly elevated few months ago, up to 170/90, patient is very active, he adjust his diet a little, ambulatory BPs are now normal. Cont amlodipine RTC 1 year

## 2018-05-06 NOTE — Assessment & Plan Note (Signed)
--  Td 2018; shingrex not available  --CCS: scope 9-09 neg except for hemorhhoids , no FH, next cscope 2019; pt aware , watch for a GI letter  --DRE  PSA wnl 2017, reassess  2020 --Diet-exercise discussed : doing great, avid hiker. --Labs: CMP, FLP, CBC, TSH

## 2018-07-02 ENCOUNTER — Encounter: Payer: Self-pay | Admitting: Gastroenterology

## 2018-07-08 ENCOUNTER — Encounter: Payer: Self-pay | Admitting: Gastroenterology

## 2018-08-24 ENCOUNTER — Ambulatory Visit (AMBULATORY_SURGERY_CENTER): Payer: Self-pay | Admitting: *Deleted

## 2018-08-24 ENCOUNTER — Encounter: Payer: Self-pay | Admitting: Gastroenterology

## 2018-08-24 VITALS — Ht 71.0 in | Wt 197.0 lb

## 2018-08-24 DIAGNOSIS — Z1211 Encounter for screening for malignant neoplasm of colon: Secondary | ICD-10-CM

## 2018-08-24 MED ORDER — NA SULFATE-K SULFATE-MG SULF 17.5-3.13-1.6 GM/177ML PO SOLN: 1.0000 | Freq: Once | ORAL | 0 refills | Status: AC

## 2018-08-24 NOTE — Progress Notes (Signed)
No egg or soy allergy known to patient  No issues with past sedation with any surgeries  or procedures, no intubation problems  No diet pills per patient No home 02 use per patient  No blood thinners per patient  Pt denies issues with constipation  No A fib or A flutter  EMMI video sent to pt's e mail - pt declined  Suprep $15 coupon to pt   

## 2018-08-25 ENCOUNTER — Telehealth: Payer: Self-pay | Admitting: Gastroenterology

## 2018-08-25 DIAGNOSIS — Z23 Encounter for immunization: Secondary | ICD-10-CM | POA: Diagnosis not present

## 2018-08-25 MED ORDER — NA SULFATE-K SULFATE-MG SULF 17.5-3.13-1.6 GM/177ML PO SOLN: 1.0000 | Freq: Once | ORAL | 0 refills | Status: AC

## 2018-08-25 NOTE — Telephone Encounter (Signed)
Resent Suprep- calleed and informed pt marie PV

## 2018-09-07 ENCOUNTER — Encounter: Payer: Self-pay | Admitting: Gastroenterology

## 2018-09-07 ENCOUNTER — Ambulatory Visit (AMBULATORY_SURGERY_CENTER): Payer: 59 | Admitting: Gastroenterology

## 2018-09-07 ENCOUNTER — Other Ambulatory Visit: Payer: Self-pay

## 2018-09-07 VITALS — BP 119/84 | HR 66 | Temp 99.8°F | Resp 18 | Ht 71.0 in | Wt 195.0 lb

## 2018-09-07 DIAGNOSIS — D120 Benign neoplasm of cecum: Secondary | ICD-10-CM

## 2018-09-07 DIAGNOSIS — Z1211 Encounter for screening for malignant neoplasm of colon: Secondary | ICD-10-CM

## 2018-09-07 DIAGNOSIS — D124 Benign neoplasm of descending colon: Secondary | ICD-10-CM

## 2018-09-07 DIAGNOSIS — D122 Benign neoplasm of ascending colon: Secondary | ICD-10-CM | POA: Diagnosis not present

## 2018-09-07 HISTORY — PX: COLONOSCOPY: SHX174

## 2018-09-07 MED ORDER — SODIUM CHLORIDE 0.9 % IV SOLN: 500.0000 mL | INTRAVENOUS | Status: DC

## 2018-09-07 MED ORDER — SODIUM CHLORIDE 0.9 % IV SOLN: 500.0000 mL | Freq: Once | INTRAVENOUS | Status: DC

## 2018-09-07 NOTE — Op Note (Signed)
[place] Patient Name: Jacob Gould Procedure Date: 09/07/2018 9:08 AM MRN: [id number] Endoscopist: Ladene Artist , MD Age: 62 Referring MD:  Date of Birth: 04/08/1956 Gender: Male Account #: [id number] Procedure:                Colonoscopy Indications:              Screening for colorectal malignant neoplasm Medicines:                Monitored Anesthesia Care Procedure:                Pre-Anesthesia Assessment:                           - Prior to the procedure, a History and Physical                            was performed, and patient medications and                            allergies were reviewed. The patient's tolerance of                            previous anesthesia was also reviewed. The risks                            and benefits of the procedure and the sedation                            options and risks were discussed with the patient.                            All questions were answered, and informed consent                            was obtained. Prior Anticoagulants: The patient has                            taken no previous anticoagulant or antiplatelet                            agents. ASA Grade Assessment: II - A patient with                            mild systemic disease. After reviewing the risks                            and benefits, the patient was deemed in                            satisfactory condition to undergo the procedure.                           After obtaining informed consent, the colonoscope  was passed under direct vision. Throughout the                            procedure, the patient's blood pressure, pulse, and                            oxygen saturations were monitored continuously. The                            Colonoscope was introduced through the anus and                            advanced to the the cecum, identified by                            appendiceal orifice and  ileocecal valve. The                            ileocecal valve, appendiceal orifice, and rectum                            were photographed. The quality of the bowel                            preparation was good. The colonoscopy was performed                            without difficulty. The patient tolerated the                            procedure well. Scope In: 9:14:54 AM Scope Out: 9:28:13 AM Scope Withdrawal Time: 0 hours 12 minutes 8 seconds  Total Procedure Duration: 0 hours 13 minutes 19 seconds  Findings:                 A 15 mm polyp was found in the appendiceal orifice.                            The polyp was sessile. The polyp was removed with a                            hot snare. Resection and retrieval were complete.                           Two sessile polyps were found in the descending                            colon. The polyps were 6 to 7 mm in size. These                            polyps were removed with a cold snare. Resection                            and  retrieval were complete.                           A 4 mm polyp was found in the ascending colon. The                            polyp was sessile. The polyp was removed with a                            cold biopsy forceps. Resection and retrieval were                            complete.                           Internal hemorrhoids were found during                            retroflexion. The hemorrhoids were medium-sized and                            Grade I (internal hemorrhoids that do not prolapse).                           The exam was otherwise without abnormality on                            direct and retroflexion views. Complications:            No immediate complications. Estimated blood loss:                            None. Estimated Blood Loss:     Estimated blood loss: none. Impression:               - One 15 mm polyp at the appendiceal orifice,                             removed with a hot snare. Resected and retrieved.                           - Two 6 to 7 mm polyps in the descending colon,                            removed with a cold snare. Resected and retrieved.                           - One 4 mm polyp in the ascending colon, removed                            with a cold biopsy forceps. Resected and retrieved.                           - Internal hemorrhoids.                           -  The examination was otherwise normal on direct                            and retroflexion views. Recommendation:           - Repeat colonoscopy date to be determined after                            pending pathology results are reviewed for                            surveillance.                           - Patient has a contact number available for                            emergencies. The signs and symptoms of potential                            delayed complications were discussed with the                            patient. Return to normal activities tomorrow.                            Written discharge instructions were provided to the                            patient.                           - Resume previous diet.                           - Continue present medications.                           - Await pathology results.                           - No aspirin, ibuprofen, naproxen, or other                            non-steroidal anti-inflammatory drugs for 2 weeks                            after polyp removal. Ladene Artist, MD 09/07/2018 9:31:43 AM This report has been signed electronically.

## 2018-09-07 NOTE — Progress Notes (Signed)
Called to room to assist during endoscopic procedure.  Patient ID and intended procedure confirmed with present staff. Received instructions for my participation in the procedure from the performing physician.  

## 2018-09-07 NOTE — Progress Notes (Signed)
A and O x3. Report to RN. Tolerated MAC anesthesia well.

## 2018-09-07 NOTE — Progress Notes (Signed)
Pt's states no medical or surgical changes since previsit or office visit. 

## 2018-09-07 NOTE — Patient Instructions (Signed)
Continue present medications. No aspirin, ibuprofen, naproxen, or other non-steriodal anti-inflammatory drugs for 2 weeks. Please read handouts provided. Await pathology results.     YOU HAD AN ENDOSCOPIC PROCEDURE TODAY AT [facility]:   Refer to the procedure report that was given to you for any specific questions about what was found during the examination.  If the procedure report does not answer your questions, please call your gastroenterologist to clarify.  If you requested that your care partner not be given the details of your procedure findings, then the procedure report has been included in a sealed envelope for you to review at your convenience later.  YOU SHOULD EXPECT: Some feelings of bloating in the abdomen. Passage of more gas than usual.  Walking can help get rid of the air that was put into your GI tract during the procedure and reduce the bloating. If you had a lower endoscopy (such as a colonoscopy or flexible sigmoidoscopy) you may notice spotting of blood in your stool or on the toilet paper. If you underwent a bowel prep for your procedure, you may not have a normal bowel movement for a few days.  Please Note:  You might notice some irritation and congestion in your nose or some drainage.  This is from the oxygen used during your procedure.  There is no need for concern and it should clear up in a day or so.  SYMPTOMS TO REPORT IMMEDIATELY:   Following lower endoscopy (colonoscopy or flexible sigmoidoscopy):  Excessive amounts of blood in the stool  Significant tenderness or worsening of abdominal pains  Swelling of the abdomen that is new, acute  Fever of 100F or higher    For urgent or emergent issues, a gastroenterologist can be reached at any hour by calling [phone].   DIET:  We do recommend a small meal at first, but then you may proceed to your regular diet.  Drink plenty of fluids but you should avoid alcoholic beverages for 24  hours.  ACTIVITY:  You should plan to take it easy for the rest of today and you should NOT DRIVE or use heavy machinery until tomorrow (because of the sedation medicines used during the test).    FOLLOW UP: Our staff will call the number listed on your records the next business day following your procedure to check on you and address any questions or concerns that you may have regarding the information given to you following your procedure. If we do not reach you, we will leave a message.  However, if you are feeling well and you are not experiencing any problems, there is no need to return our call.  We will assume that you have returned to your regular daily activities without incident.  If any biopsies were taken you will be contacted by phone or by letter within the next 1-3 weeks.  Please call us at [phone] if you have not heard about the biopsies in 3 weeks.    SIGNATURES/CONFIDENTIALITY: You and/or your care partner have signed paperwork which will be entered into your electronic medical record.  These signatures attest to the fact that that the information above on your After Visit Summary has been reviewed and is understood.  Full responsibility of the confidentiality of this discharge information lies with you and/or your care-partner.

## 2018-09-08 ENCOUNTER — Telehealth: Payer: Self-pay

## 2018-09-08 NOTE — Telephone Encounter (Signed)
  Follow up Call-  Call back number 09/07/2018  Post procedure Call Back phone  # [phone]  Permission to leave phone message Yes  Some recent data might be hidden     Patient questions:  Do you have a fever, pain , or abdominal swelling? No. Pain Score  0 *  Have you tolerated food without any problems? Yes.    Have you been able to return to your normal activities? Yes.    Do you have any questions about your discharge instructions: Diet   No. Medications  No. Follow up visit  No.  Do you have questions or concerns about your Care? No.  Actions: * If pain score is 4 or above: No action needed, pain <4.

## 2018-09-21 ENCOUNTER — Encounter: Payer: Self-pay | Admitting: Gastroenterology

## 2018-10-11 ENCOUNTER — Other Ambulatory Visit: Payer: Self-pay | Admitting: Internal Medicine

## 2018-10-14 DIAGNOSIS — R1030 Lower abdominal pain, unspecified: Secondary | ICD-10-CM | POA: Diagnosis not present

## 2018-10-14 DIAGNOSIS — R350 Frequency of micturition: Secondary | ICD-10-CM | POA: Diagnosis not present

## 2019-05-12 ENCOUNTER — Encounter: Payer: 59 | Admitting: Internal Medicine

## 2019-05-18 ENCOUNTER — Other Ambulatory Visit: Payer: Self-pay

## 2019-05-19 ENCOUNTER — Encounter: Payer: Self-pay | Admitting: Internal Medicine

## 2019-05-19 ENCOUNTER — Ambulatory Visit (INDEPENDENT_AMBULATORY_CARE_PROVIDER_SITE_OTHER): Payer: 59 | Admitting: Internal Medicine

## 2019-05-19 VITALS — BP 141/96 | HR 74 | Temp 98.1°F | Resp 16 | Ht 71.0 in | Wt 196.5 lb

## 2019-05-19 DIAGNOSIS — R739 Hyperglycemia, unspecified: Secondary | ICD-10-CM | POA: Diagnosis not present

## 2019-05-19 DIAGNOSIS — Z0000 Encounter for general adult medical examination without abnormal findings: Secondary | ICD-10-CM | POA: Diagnosis not present

## 2019-05-19 LAB — CBC WITH DIFFERENTIAL/PLATELET
Basophils Absolute: 0 10*3/uL (ref 0.0–0.1)
Basophils Relative: 0.6 % (ref 0.0–3.0)
Eosinophils Absolute: 0.1 10*3/uL (ref 0.0–0.7)
Eosinophils Relative: 1.9 % (ref 0.0–5.0)
HCT: 46 % (ref 39.0–52.0)
Hemoglobin: 15.7 g/dL (ref 13.0–17.0)
Lymphocytes Relative: 33.3 % (ref 12.0–46.0)
Lymphs Abs: 1.5 10*3/uL (ref 0.7–4.0)
MCHC: 34.1 g/dL (ref 30.0–36.0)
MCV: 90.6 fl (ref 78.0–100.0)
Monocytes Absolute: 0.3 10*3/uL (ref 0.1–1.0)
Monocytes Relative: 6.7 % (ref 3.0–12.0)
Neutro Abs: 2.6 10*3/uL (ref 1.4–7.7)
Neutrophils Relative %: 57.5 % (ref 43.0–77.0)
Platelets: 202 10*3/uL (ref 150.0–400.0)
RBC: 5.08 Mil/uL (ref 4.22–5.81)
RDW: 13 % (ref 11.5–15.5)
WBC: 4.6 10*3/uL (ref 4.0–10.5)

## 2019-05-19 LAB — LIPID PANEL
Cholesterol: 171 mg/dL (ref 0–200)
HDL: 50.2 mg/dL (ref >39.00)
LDL Cholesterol: 105 mg/dL — ABNORMAL HIGH (ref 0–99)
NonHDL: 120.91
Total CHOL/HDL Ratio: 3
Triglycerides: 81 mg/dL (ref 0.0–149.0)
VLDL: 16.2 mg/dL (ref 0.0–40.0)

## 2019-05-19 LAB — COMPREHENSIVE METABOLIC PANEL
ALT: 11 U/L (ref 0–53)
AST: 13 U/L (ref 0–37)
Albumin: 4.4 g/dL (ref 3.5–5.2)
Alkaline Phosphatase: 54 U/L (ref 39–117)
BUN: 14 mg/dL (ref 6–23)
CO2: 29 mEq/L (ref 19–32)
Calcium: 9.1 mg/dL (ref 8.4–10.5)
Chloride: 104 mEq/L (ref 96–112)
Creatinine, Ser: 0.95 mg/dL (ref 0.40–1.50)
GFR: 80.49 mL/min (ref >60.00)
Glucose, Bld: 95 mg/dL (ref 70–99)
Potassium: 4.2 mEq/L (ref 3.5–5.1)
Sodium: 139 mEq/L (ref 135–145)
Total Bilirubin: 1.6 mg/dL — ABNORMAL HIGH (ref 0.2–1.2)
Total Protein: 7 g/dL (ref 6.0–8.3)

## 2019-05-19 LAB — PSA: PSA: 1.66 ng/mL (ref 0.10–4.00)

## 2019-05-19 MED ORDER — SHINGRIX 50 MCG/0.5ML IM SUSR: 0.5000 mL | Freq: Once | INTRAMUSCULAR | 1 refills | Status: AC

## 2019-05-19 NOTE — Patient Instructions (Signed)
Get the blood work     [facility] Schedule your next appointment   for a physical exam in 1 year  Consider Shingrix

## 2019-05-19 NOTE — Progress Notes (Signed)
Pre visit review using our clinic review tool, if applicable. No additional management support is needed unless otherwise documented below in the visit note. 

## 2019-05-19 NOTE — Assessment & Plan Note (Addendum)
--  Td 2018; shingrex not available here , rx printed.  Recommend early flu shot this season --CCS: scope 9-09 neg except for hemorrhoids; cscope 10-219, next per GI --DRE wnl, check a PSA  --Diet-exercise discussed : doing great, frequent biker. --Labs: CMP, FLP, CBC, A1c, PSA

## 2019-05-19 NOTE — Progress Notes (Signed)
Subjective:    Patient ID: Jacob Gould, male    DOB: July 11, 1956, 63 y.o.   MRN: [id number]  DOS:  05/19/2019 Type of visit - description: cpx In general feeling well, no major concerns, very active bike rider.  Feels well.  BP Readings from Last 3 Encounters:  05/19/19 (!) 141/96  09/07/18 119/84  05/06/18 122/70     Review of Systems A 14 point review of systems is negative    Past Medical History:  Diagnosis Date  . Allergic rhinitis   . Allergy   . GERD (gastroesophageal reflux disease)   . Hypertension   . Low back pain    intermittent    Past Surgical History:  Procedure Laterality Date  . COLONOSCOPY    . toe injury  2015   R great toe, saw ortho    Social History   Socioeconomic History  . Marital status: Married    Spouse name: Not on file  . Number of children: 3  . Years of education: Not on file  . Highest education level: Not on file  Occupational History  . Occupation: Building control surveyor   Social Needs  . Financial resource strain: Not on file  . Food insecurity    Worry: Not on file    Inability: Not on file  . Transportation needs    Medical: Not on file    Non-medical: Not on file  Tobacco Use  . Smoking status: Never Smoker  . Smokeless tobacco: Former Systems developer  . Tobacco comment: quit in the 90s  Substance and Sexual Activity  . Alcohol use: Yes    Comment: wine at night   . Drug use: No  . Sexual activity: Not on file  Lifestyle  . Physical activity    Days per week: Not on file    Minutes per session: Not on file  . Stress: Not on file  Relationships  . Social Herbalist on phone: Not on file    Gets together: Not on file    Attends religious service: Not on file    Active member of club or organization: Not on file    Attends meetings of clubs or organizations: Not on file    Relationship status: Not on file  . Intimate partner violence    Fear of current or ex partner: Not on file    Emotionally abused: Not on  file    Physically abused: Not on file    Forced sexual activity: Not on file  Other Topics Concern  . Not on file  Social History Narrative   3 adult children, 3 G-kids    Youngest son is in [employer]     Family History  Problem Relation Age of Onset  . Hypertension Mother        also GF  . Diabetes Neg Hx   . Colon cancer Neg Hx   . Prostate cancer Neg Hx   . Heart attack Neg Hx   . Colon polyps Neg Hx   . Rectal cancer Neg Hx   . Stomach cancer Neg Hx   . Esophageal cancer Neg Hx      Allergies as of 05/19/2019   No Known Allergies     Medication List       Accurate as of May 19, 2019 11:59 PM. If you have any questions, ask your nurse or doctor.        amLODipine 10 MG tablet Commonly known as:  NORVASC Take 1 tablet (10 mg total) by mouth daily.   fluticasone 50 MCG/ACT nasal spray Commonly known as: FLONASE Place into both nostrils daily.   Shingrix injection Generic drug: Zoster Vaccine Adjuvanted Inject 0.5 mLs into the muscle once for 1 dose. Started by: Kathlene November, MD           Objective:   Physical Exam BP (!) 141/96 (BP Location: Left Arm, Patient Position: Sitting, Cuff Size: Small)   Pulse 74   Temp 98.1 F (36.7 C) (Oral)   Resp 16   Ht 5\' 11"  (1.803 m)   Wt 196 lb 8 oz (89.1 kg)   SpO2 100%   BMI 27.41 kg/m  General: Well developed, NAD, BMI noted Neck: No  thyromegaly  HEENT:  Normocephalic . Face symmetric, atraumatic Lungs:  CTA B Normal respiratory effort, no intercostal retractions, no accessory muscle use. Heart: RRR,  no murmur.  No pretibial edema bilaterally  Abdomen:  Not distended, soft, non-tender. No rebound or rigidity. DRE: Normal sphincter tone, no stools, prostate gland symmetric, soft, nontender, nonnodular. Skin: Exposed areas without rash. Not pale. Not jaundice Neurologic:  alert & oriented X3.  Speech normal, gait appropriate for age and unassisted Strength symmetric and appropriate for age.  Psych:  Cognition and judgment appear intact.  Cooperative with normal attention span and concentration.  Behavior appropriate. No anxious or depressed appearing.     Assessment     Assessment Hyperglycemia  A1c 5.7 (2012) HTN GERD -- PPIs prn Occasional back pain Seasonal allergies  Plan: Hyperglycemia: Check A1c HTN: On amlodipine, BP today 141/96.  This was consistent with the patient's own cuff.  Ambulatory BP with his own cuff is excellent ranging from 103-136 with a DBP of 70.  No change. RTC 1 year

## 2019-05-20 LAB — HEMOGLOBIN A1C: Hgb A1c MFr Bld: 5.3 % (ref 4.6–6.5)

## 2019-05-20 NOTE — Assessment & Plan Note (Signed)
Hyperglycemia: Check A1c HTN: On amlodipine, BP today 141/96.  This was consistent with the patient's own cuff.  Ambulatory BP with his own cuff is excellent ranging from 103-136 with a DBP of 70.  No change. RTC 1 year

## 2019-05-21 ENCOUNTER — Telehealth: Payer: Self-pay

## 2019-05-21 NOTE — Telephone Encounter (Signed)
Physical form completed and faxed to [facility] at [phone]. Form sent for scanning. Received fax confirmation.  

## 2019-05-29 ENCOUNTER — Other Ambulatory Visit: Payer: Self-pay | Admitting: Internal Medicine

## 2019-10-30 ENCOUNTER — Encounter: Payer: Self-pay | Admitting: Internal Medicine

## 2019-11-01 ENCOUNTER — Other Ambulatory Visit: Payer: Self-pay

## 2019-11-02 ENCOUNTER — Other Ambulatory Visit: Payer: Self-pay

## 2019-11-02 ENCOUNTER — Encounter: Payer: Self-pay | Admitting: Internal Medicine

## 2019-11-02 ENCOUNTER — Ambulatory Visit (INDEPENDENT_AMBULATORY_CARE_PROVIDER_SITE_OTHER): Payer: 59 | Admitting: Internal Medicine

## 2019-11-02 VITALS — BP 155/82 | HR 90 | Temp 97.5°F | Resp 18 | Ht 71.0 in | Wt 196.0 lb

## 2019-11-02 DIAGNOSIS — F439 Reaction to severe stress, unspecified: Secondary | ICD-10-CM | POA: Diagnosis not present

## 2019-11-02 DIAGNOSIS — R0989 Other specified symptoms and signs involving the circulatory and respiratory systems: Secondary | ICD-10-CM | POA: Diagnosis not present

## 2019-11-02 LAB — BASIC METABOLIC PANEL
BUN: 13 mg/dL (ref 6–23)
CO2: 27 mEq/L (ref 19–32)
Calcium: 9.5 mg/dL (ref 8.4–10.5)
Chloride: 104 mEq/L (ref 96–112)
Creatinine, Ser: 0.98 mg/dL (ref 0.40–1.50)
GFR: 77.54 mL/min (ref >60.00)
Glucose, Bld: 100 mg/dL — ABNORMAL HIGH (ref 70–99)
Potassium: 4.1 mEq/L (ref 3.5–5.1)
Sodium: 138 mEq/L (ref 135–145)

## 2019-11-02 LAB — CBC WITH DIFFERENTIAL/PLATELET
Basophils Absolute: 0 10*3/uL (ref 0.0–0.1)
Basophils Relative: 0.5 % (ref 0.0–3.0)
Eosinophils Absolute: 0.1 10*3/uL (ref 0.0–0.7)
Eosinophils Relative: 1.3 % (ref 0.0–5.0)
HCT: 48.4 % (ref 39.0–52.0)
Hemoglobin: 16.6 g/dL (ref 13.0–17.0)
Lymphocytes Relative: 28.2 % (ref 12.0–46.0)
Lymphs Abs: 1.5 10*3/uL (ref 0.7–4.0)
MCHC: 34.2 g/dL (ref 30.0–36.0)
MCV: 90.8 fl (ref 78.0–100.0)
Monocytes Absolute: 0.4 10*3/uL (ref 0.1–1.0)
Monocytes Relative: 8.1 % (ref 3.0–12.0)
Neutro Abs: 3.3 10*3/uL (ref 1.4–7.7)
Neutrophils Relative %: 61.9 % (ref 43.0–77.0)
Platelets: 214 10*3/uL (ref 150.0–400.0)
RBC: 5.32 Mil/uL (ref 4.22–5.81)
RDW: 12.8 % (ref 11.5–15.5)
WBC: 5.3 10*3/uL (ref 4.0–10.5)

## 2019-11-02 LAB — TSH: TSH: 3.07 u[IU]/mL (ref 0.35–4.50)

## 2019-11-02 NOTE — Progress Notes (Signed)
Subjective:    Patient ID: Jacob Gould, male    DOB: Jan 26, 1956, 63 y.o.   MRN: [id number]  DOS:  11/02/2019 Type of visit - description: Acute The patient check his blood pressures frequently at home, sometimes twice a day. Heart rate has been is slightly higher than usual but never more than 90. BPs are still very good in the 135 range but he used to be slightly lower. He is concerned because his monitor is reading "irregular heartbeat".   Review of Systems Denies difficulty breathing or palpitations per se. He is a still very active and ride his bike.  When he ride his bike he is asymptomatic. After the bike ride he has usual aches and pains at the legs, arms and including pectoral soreness but   no substernal chest pain   stress increased mostly due to the quarantine. Is not taking any decongestants, is taking "stress tea" herbal remedy for few months   Past Medical History:  Diagnosis Date  . Allergic rhinitis   . Allergy   . GERD (gastroesophageal reflux disease)   . Hypertension   . Low back pain    intermittent    Past Surgical History:  Procedure Laterality Date  . COLONOSCOPY    . toe injury  2015   R great toe, saw ortho    Social History   Socioeconomic History  . Marital status: Married    Spouse name: Not on file  . Number of children: 3  . Years of education: Not on file  . Highest education level: Not on file  Occupational History  . Occupation: Building control surveyor   Social Needs  . Financial resource strain: Not on file  . Food insecurity    Worry: Not on file    Inability: Not on file  . Transportation needs    Medical: Not on file    Non-medical: Not on file  Tobacco Use  . Smoking status: Never Smoker  . Smokeless tobacco: Former Systems developer  . Tobacco comment: quit in the 90s  Substance and Sexual Activity  . Alcohol use: Yes    Comment: wine at night   . Drug use: No  . Sexual activity: Not on file  Lifestyle  . Physical activity     Days per week: Not on file    Minutes per session: Not on file  . Stress: Not on file  Relationships  . Social Herbalist on phone: Not on file    Gets together: Not on file    Attends religious service: Not on file    Active member of club or organization: Not on file    Attends meetings of clubs or organizations: Not on file    Relationship status: Not on file  . Intimate partner violence    Fear of current or ex partner: Not on file    Emotionally abused: Not on file    Physically abused: Not on file    Forced sexual activity: Not on file  Other Topics Concern  . Not on file  Social History Narrative   3 adult children, 3 G-kids    Youngest son is in [employer]      Allergies as of 11/02/2019   No Known Allergies     Medication List       Accurate as of November 02, 2019  8:45 AM. If you have any questions, ask your nurse or doctor.  amLODipine 10 MG tablet Commonly known as: NORVASC Take 1 tablet (10 mg total) by mouth daily.   fluticasone 50 MCG/ACT nasal spray Commonly known as: FLONASE Place into both nostrils daily.           Objective:   Physical Exam BP (!) 155/82 (BP Location: Left Arm, Patient Position: Sitting, Cuff Size: Normal)   Pulse 90   Temp (!) 97.5 F (36.4 C) (Temporal)   Resp 18   Ht 5\' 11"  (1.803 m)   Wt 196 lb (88.9 kg)   SpO2 100%   BMI 27.34 kg/m  General:   Well developed, NAD, BMI noted.  HEENT:  Normocephalic . Face symmetric, atraumatic Lungs:  CTA B Normal respiratory effort, no intercostal retractions, no accessory muscle use. Heart: RRR,  no murmur.  no pretibial edema bilaterally  Abdomen:  Not distended, soft, non-tender. No rebound or rigidity.   Skin: Not pale. Not jaundice Neurologic:  alert & oriented X3.  Speech normal, gait appropriate for age and unassisted Psych--  Cognition and judgment appear intact.  Cooperative with normal attention span and concentration.  Behavior appropriate.  No anxious or depressed appearing.     Assessment      Assessment Hyperglycemia  A1c 5.7 (2012) HTN GERD -- PPIs prn Occasional back pain Seasonal allergies   PLAN Irregular heartbeat?  As reported by his blood pressure monitor.  No associated symptoms.  EKG today NSR. For completeness we will get a CBC, BMP and TSH Discuss Holter monitor versus observation. We agreed on observation for now and stop herbal supplements Call if not better Stress : Patient also wonders about stress management, options include continued physical activity, yoga, mindfulness, counseling and medication.  We agreed that medication is not what he needs at this point, will try the other options, information about our counselors provided but will call if something changes. RTC next year for CPX     This visit occurred during the SARS-CoV-2 public health emergency.  Safety protocols were in place, including screening questions prior to the visit, additional usage of staff PPE, and extensive cleaning of exam room while observing appropriate contact time as indicated for disinfecting solutions.

## 2019-11-02 NOTE — Progress Notes (Signed)
Pre visit review using our clinic review tool, if applicable. No additional management support is needed unless otherwise documented below in the visit note. 

## 2019-11-02 NOTE — Patient Instructions (Addendum)
GO TO THE LAB : Get the blood work      Continue checking your blood pressure 3-4 times a week at most BP GOAL is between 110/65 and  135/85. If it is consistently higher or lower, let me know

## 2019-11-03 NOTE — Assessment & Plan Note (Signed)
Irregular heartbeat?  As reported by his blood pressure monitor.  No associated symptoms.  EKG today NSR. For completeness we will get a CBC, BMP and TSH Discuss Holter monitor versus observation. We agreed on observation for now and stop herbal supplements Call if not better Stress : Patient also wonders about stress management, options include continued physical activity, yoga, mindfulness, counseling and medication.  We agreed that medication is not what he needs at this point, will try the other options, information about our counselors provided but will call if something changes. RTC next year for CPX

## 2020-02-05 ENCOUNTER — Ambulatory Visit: Payer: 59 | Attending: Internal Medicine

## 2020-02-05 DIAGNOSIS — Z23 Encounter for immunization: Secondary | ICD-10-CM

## 2020-02-05 NOTE — Progress Notes (Signed)
   Covid-19 Vaccination Clinic  Name:  [name]    MRN: [id number] DOB: 1956/10/06  02/05/2020  Mr. [name] was observed post Covid-19 immunization for 15 minutes without incident. He was provided with Vaccine Information Sheet and instruction to access the V-Safe system.   Mr. [name] was instructed to call 911 with any severe reactions post vaccine: Marland Kitchen Difficulty breathing  . Swelling of face and throat  . A fast heartbeat  . A bad rash all over body  . Dizziness and weakness   Immunizations Administered    Name Date Dose VIS Date Route   Pfizer COVID-19 Vaccine 02/05/2020  8:23 AM 0.3 mL 11/05/2019 Intramuscular   Manufacturer: [place]   Lot: [id number]   [place]: [id number]

## 2020-02-28 ENCOUNTER — Ambulatory Visit: Payer: 59 | Attending: Internal Medicine

## 2020-02-28 ENCOUNTER — Ambulatory Visit: Payer: 59

## 2020-02-28 DIAGNOSIS — Z23 Encounter for immunization: Secondary | ICD-10-CM

## 2020-02-28 NOTE — Progress Notes (Signed)
   Covid-19 Vaccination Clinic  Name:  KURK SNAY    MRN: [id number] DOB: 1956-08-27  02/28/2020  Mr. Paone was observed post Covid-19 immunization for 15 minutes without incident. He was provided with Vaccine Information Sheet and instruction to access the V-Safe system.   Mr. Mcclarnon was instructed to call 911 with any severe reactions post vaccine: Marland Kitchen Difficulty breathing  . Swelling of face and throat  . A fast heartbeat  . A bad rash all over body  . Dizziness and weakness   Immunizations Administered    Name Date Dose VIS Date Route   Pfizer COVID-19 Vaccine 02/28/2020  4:18 PM 0.3 mL 11/05/2019 Intramuscular   Manufacturer: [employer], [employer]   Lot: [id number]   [place]: [id number]

## 2020-05-15 ENCOUNTER — Other Ambulatory Visit: Payer: Self-pay | Admitting: Internal Medicine

## 2020-05-19 ENCOUNTER — Other Ambulatory Visit: Payer: Self-pay

## 2020-05-19 ENCOUNTER — Encounter: Payer: Self-pay | Admitting: Internal Medicine

## 2020-05-19 ENCOUNTER — Ambulatory Visit (INDEPENDENT_AMBULATORY_CARE_PROVIDER_SITE_OTHER): Payer: 59 | Admitting: Internal Medicine

## 2020-05-19 VITALS — BP 132/91 | HR 90 | Temp 97.5°F | Resp 18 | Ht 71.0 in | Wt 194.0 lb

## 2020-05-19 DIAGNOSIS — Z0000 Encounter for general adult medical examination without abnormal findings: Secondary | ICD-10-CM

## 2020-05-19 DIAGNOSIS — I499 Cardiac arrhythmia, unspecified: Secondary | ICD-10-CM

## 2020-05-19 LAB — COMPREHENSIVE METABOLIC PANEL
ALT: 12 U/L (ref 0–53)
AST: 14 U/L (ref 0–37)
Albumin: 4.4 g/dL (ref 3.5–5.2)
Alkaline Phosphatase: 51 U/L (ref 39–117)
BUN: 12 mg/dL (ref 6–23)
CO2: 25 mEq/L (ref 19–32)
Calcium: 9.2 mg/dL (ref 8.4–10.5)
Chloride: 104 mEq/L (ref 96–112)
Creatinine, Ser: 0.91 mg/dL (ref 0.40–1.50)
GFR: 84.31 mL/min (ref >60.00)
Glucose, Bld: 95 mg/dL (ref 70–99)
Potassium: 4.1 mEq/L (ref 3.5–5.1)
Sodium: 139 mEq/L (ref 135–145)
Total Bilirubin: 1.5 mg/dL — ABNORMAL HIGH (ref 0.2–1.2)
Total Protein: 6.8 g/dL (ref 6.0–8.3)

## 2020-05-19 LAB — LIPID PANEL
Cholesterol: 170 mg/dL (ref 0–200)
HDL: 45 mg/dL (ref >39.00)
LDL Cholesterol: 106 mg/dL — ABNORMAL HIGH (ref 0–99)
NonHDL: 125.05
Total CHOL/HDL Ratio: 4
Triglycerides: 97 mg/dL (ref 0.0–149.0)
VLDL: 19.4 mg/dL (ref 0.0–40.0)

## 2020-05-19 LAB — TSH: TSH: 3.05 u[IU]/mL (ref 0.35–4.50)

## 2020-05-19 NOTE — Progress Notes (Signed)
Pre visit review using our clinic review tool, if applicable. No additional management support is needed unless otherwise documented below in the visit note. 

## 2020-05-19 NOTE — Progress Notes (Signed)
   Subjective:    Patient ID: Jacob Gould, male    DOB: 1956-10-07, 64 y.o.   MRN: [id number]  DOS:  05/19/2020 Type of visit - description: CPX Since the last office visit he is doing well.   Review of Systems   A 14 point review of systems is negative     Past Medical History:  Diagnosis Date  . Allergic rhinitis   . Allergy   . GERD (gastroesophageal reflux disease)   . Hypertension   . Low back pain    intermittent    Past Surgical History:  Procedure Laterality Date  . COLONOSCOPY    . toe injury  2015   R great toe, saw ortho    Allergies as of 05/19/2020   No Known Allergies     Medication List       Accurate as of May 19, 2020 11:59 PM. If you have any questions, ask your nurse or doctor.        amLODipine 10 MG tablet Commonly known as: NORVASC Take 1 tablet (10 mg total) by mouth daily.   fluticasone 50 MCG/ACT nasal spray Commonly known as: FLONASE Place into both nostrils daily.          Objective:   Physical Exam BP (!) 132/91 (BP Location: Left Arm, Patient Position: Sitting, Cuff Size: Normal)   Pulse 90   Temp (!) 97.5 F (36.4 C) (Temporal)   Resp 18   Ht 5\' 11"  (1.803 m)   Wt 194 lb (88 kg)   SpO2 100%   BMI 27.06 kg/m  General: Well developed, NAD, BMI noted Neck: No  thyromegaly  HEENT:  Normocephalic . Face symmetric, atraumatic Lungs:  CTA B Normal respiratory effort, no intercostal retractions, no accessory muscle use. Heart: Seems regular with occasional skipped beat, every 9-10 heartbeats.  no murmur.  Abdomen:  Not distended, soft, non-tender. No rebound or rigidity.   Lower extremities: no pretibial edema bilaterally  Skin: Exposed areas without rash. Not pale. Not jaundice Neurologic:  alert & oriented X3.  Speech normal, gait appropriate for age and unassisted Strength symmetric and appropriate for age.  Psych: Cognition and judgment appear intact.  Cooperative with normal attention span and  concentration.  Behavior appropriate. No anxious or depressed appearing.     Assessment    Assessment Hyperglycemia  A1c 5.7 (2012) HTN GERD -- PPIs prn Occasional back pain Seasonal allergies   PLAN Here for CPX Irregular heartbeat?  see last visit, now asx  occasional skipped beat noted on the physical exam. EKG today: Sinus arrhythmia.  Patient to let me know if symptoms resurface. HTN: On amlodipine, good compliance, BP today 132/91, recommend to check ambulatory BPs, call if not at goal. RTC 1 year, sooner if needed     This visit occurred during the SARS-CoV-2 public health emergency.  Safety protocols were in place, including screening questions prior to the visit, additional usage of staff PPE, and extensive cleaning of exam room while observing appropriate contact time as indicated for disinfecting solutions.

## 2020-05-19 NOTE — Patient Instructions (Signed)
Check the  blood pressure   weekly   BP GOAL is between 110/65 and  135/85. If it is consistently higher or lower, let me know    GO TO THE LAB : Get the blood work     GO TO THE FRONT DESK, PLEASE SCHEDULE YOUR APPOINTMENTS Come back for a physical exam in 1 year 

## 2020-05-20 NOTE — Assessment & Plan Note (Signed)
Here for CPX Irregular heartbeat?  see last visit, now asx  occasional skipped beat noted on the physical exam. EKG today: Sinus arrhythmia.  Patient to let me know if symptoms resurface. HTN: On amlodipine, good compliance, BP today 132/91, recommend to check ambulatory BPs, call if not at goal. RTC 1 year, sooner if needed

## 2020-05-20 NOTE — Assessment & Plan Note (Signed)
--  Td 2018 -Shingrex x2 (2020) -Had Covid shots -Recommend yearly flu shot --CCS: scope 9-09 neg except for hemorrhoids; cscope 10-219, next per GI --DRE and PSA normal 2020   --Diet-exercise: Rides his bike daily, trying to watch his diet, doing well --Labs:  Reviewed, needs a CMP, FLP, TSH

## 2020-06-16 ENCOUNTER — Encounter: Payer: Self-pay | Admitting: Internal Medicine

## 2020-08-10 ENCOUNTER — Other Ambulatory Visit: Payer: Self-pay | Admitting: Internal Medicine

## 2021-05-25 ENCOUNTER — Encounter: Payer: Self-pay | Admitting: Internal Medicine

## 2021-05-25 ENCOUNTER — Other Ambulatory Visit: Payer: Self-pay

## 2021-05-25 ENCOUNTER — Ambulatory Visit (INDEPENDENT_AMBULATORY_CARE_PROVIDER_SITE_OTHER): Payer: BC Managed Care – PPO | Admitting: Internal Medicine

## 2021-05-25 VITALS — BP 132/86 | HR 90 | Temp 97.9°F | Resp 16 | Ht 71.0 in | Wt 198.5 lb

## 2021-05-25 DIAGNOSIS — Z0000 Encounter for general adult medical examination without abnormal findings: Secondary | ICD-10-CM | POA: Diagnosis not present

## 2021-05-25 DIAGNOSIS — R739 Hyperglycemia, unspecified: Secondary | ICD-10-CM | POA: Diagnosis not present

## 2021-05-25 DIAGNOSIS — Z125 Encounter for screening for malignant neoplasm of prostate: Secondary | ICD-10-CM | POA: Diagnosis not present

## 2021-05-25 LAB — CBC WITH DIFFERENTIAL/PLATELET
Basophils Absolute: 0 10*3/uL (ref 0.0–0.1)
Basophils Relative: 0.4 % (ref 0.0–3.0)
Eosinophils Absolute: 0.1 10*3/uL (ref 0.0–0.7)
Eosinophils Relative: 2 % (ref 0.0–5.0)
HCT: 47.1 % (ref 39.0–52.0)
Hemoglobin: 16.1 g/dL (ref 13.0–17.0)
Lymphocytes Relative: 29 % (ref 12.0–46.0)
Lymphs Abs: 1.5 10*3/uL (ref 0.7–4.0)
MCHC: 34.2 g/dL (ref 30.0–36.0)
MCV: 89.9 fl (ref 78.0–100.0)
Monocytes Absolute: 0.3 10*3/uL (ref 0.1–1.0)
Monocytes Relative: 6.4 % (ref 3.0–12.0)
Neutro Abs: 3.3 10*3/uL (ref 1.4–7.7)
Neutrophils Relative %: 62.2 % (ref 43.0–77.0)
Platelets: 208 10*3/uL (ref 150.0–400.0)
RBC: 5.24 Mil/uL (ref 4.22–5.81)
RDW: 12.7 % (ref 11.5–15.5)
WBC: 5.3 10*3/uL (ref 4.0–10.5)

## 2021-05-25 LAB — PSA: PSA: 2.41 ng/mL (ref 0.10–4.00)

## 2021-05-25 LAB — LIPID PANEL
Cholesterol: 169 mg/dL (ref 0–200)
HDL: 44.4 mg/dL (ref >39.00)
LDL Cholesterol: 107 mg/dL — ABNORMAL HIGH (ref 0–99)
NonHDL: 124.83
Total CHOL/HDL Ratio: 4
Triglycerides: 89 mg/dL (ref 0.0–149.0)
VLDL: 17.8 mg/dL (ref 0.0–40.0)

## 2021-05-25 LAB — COMPREHENSIVE METABOLIC PANEL
ALT: 15 U/L (ref 0–53)
AST: 15 U/L (ref 0–37)
Albumin: 4.6 g/dL (ref 3.5–5.2)
Alkaline Phosphatase: 52 U/L (ref 39–117)
BUN: 20 mg/dL (ref 6–23)
CO2: 28 mEq/L (ref 19–32)
Calcium: 9.4 mg/dL (ref 8.4–10.5)
Chloride: 104 mEq/L (ref 96–112)
Creatinine, Ser: 1.06 mg/dL (ref 0.40–1.50)
GFR: 74.74 mL/min (ref >60.00)
Glucose, Bld: 90 mg/dL (ref 70–99)
Potassium: 4.3 mEq/L (ref 3.5–5.1)
Sodium: 140 mEq/L (ref 135–145)
Total Bilirubin: 2 mg/dL — ABNORMAL HIGH (ref 0.2–1.2)
Total Protein: 7.3 g/dL (ref 6.0–8.3)

## 2021-05-25 LAB — HEMOGLOBIN A1C: Hgb A1c MFr Bld: 5.6 % (ref 4.6–6.5)

## 2021-05-25 LAB — TSH: TSH: 2.16 u[IU]/mL (ref 0.35–5.50)

## 2021-05-25 MED ORDER — SILDENAFIL CITRATE 20 MG PO TABS: 60.0000 mg | ORAL_TABLET | Freq: Every evening | ORAL | 3 refills | Status: DC | PRN

## 2021-05-25 NOTE — Progress Notes (Signed)
Subjective:    Patient ID: Jacob Gould, male    DOB: 01/31/1956, 65 y.o.   MRN: [id number]  DOS:  05/25/2021 Type of visit - description: CPX  Since the last year, he retired and is doing well.  Has no concerns except difficulty with erections for the last 3 months. Quality of erections is decreased, sometimes it is difficult to penetrate. He denies chest pain, claudication. No anxiety or depression Is not taking any new medications or supplements. He is extremely active riding his bike without any difficulty  Review of Systems  Other than above, a 14 point review of systems is negative     Past Medical History:  Diagnosis Date   Allergic rhinitis    Allergy    GERD (gastroesophageal reflux disease)    Hypertension    Low back pain    intermittent    Past Surgical History:  Procedure Laterality Date   COLONOSCOPY     toe injury  2015   R great toe, saw ortho   Social History   Socioeconomic History   Marital status: Married    Spouse name: Not on file   Number of children: 3   Years of education: Not on file   Highest education level: Not on file  Occupational History   Occupation: retired [employer] store  Tobacco Use   Smoking status: Former    Pack years: 0.00   Smokeless tobacco: Former   Tobacco comments:    quit in the 90s  Substance and Sexual Activity   Alcohol use: Yes    Comment: wine at night    Drug use: No   Sexual activity: Not on file  Other Topics Concern   Not on file  Social History Narrative   3 adult children, 3 G-kids    Youngest son is in [employer], academic   Social Determinants of Health   Financial Resource Strain: Not on file  Food Insecurity: Not on file  Transportation Needs: Not on file  Physical Activity: Not on file  Stress: Not on file  Social Connections: Not on file  Intimate Partner Violence: Not on file    Allergies as of 05/25/2021   No Known Allergies      Medication List        Accurate as of May 25, 2021 11:59 PM. If you have any questions, ask your nurse or doctor.          amLODipine 10 MG tablet Commonly known as: NORVASC Take 1 tablet (10 mg total) by mouth daily.   fluticasone 50 MCG/ACT nasal spray Commonly known as: FLONASE Place into both nostrils daily.   sildenafil 20 MG tablet Commonly known as: REVATIO Take 3-4 tablets (60-80 mg total) by mouth at bedtime as needed. Started by: Kathlene November, MD           Objective:   Physical Exam BP 132/86 (BP Location: Left Arm, Patient Position: Sitting, Cuff Size: Normal)   Pulse 90   Temp 97.9 F (36.6 C) (Oral)   Resp 16   Ht 5\' 11"  (1.803 m)   Wt 198 lb 8 oz (90 kg)   SpO2 98%   BMI 27.69 kg/m  General: Well developed, NAD, BMI noted Neck: No  thyromegaly  HEENT:  Normocephalic . Face symmetric, atraumatic Lungs:  CTA B Normal respiratory effort, no intercostal retractions, no accessory muscle use. Heart: RRR,  no murmur.  Abdomen:  Not distended, soft, non-tender. No rebound or rigidity.  Lower extremities: no pretibial edema bilaterally.  Normal femoral pulses. DRE: Normal sphincter tone, no stools found, prostate normal Skin: Exposed areas without rash. Not pale. Not jaundice Neurologic:  alert & oriented X3.  Speech normal, gait appropriate for age and unassisted Strength symmetric and appropriate for age.  Psych: Cognition and judgment appear intact.  Cooperative with normal attention span and concentration.  Behavior appropriate. No anxious or depressed appearing.     Assessment    Assessment Hyperglycemia  A1c 5.7 (2012) HTN GERD -- PPIs prn Occasional back pain Seasonal allergies   PLAN Here for CPX Hyperglycemia: Labs HTN: On amlodipine, BP is okay today, recommend to check at home ED: New issue, no cardiovascular symptoms, rec a trial with sildenafil, how to use it, what to expect discussed with the patient. Social: Retired, doing great. RTC 1 year.     This visit occurred  during the SARS-CoV-2 public health emergency.  Safety protocols were in place, including screening questions prior to the visit, additional usage of staff PPE, and extensive cleaning of exam room while observing appropriate contact time as indicated for disinfecting solutions.

## 2021-05-25 NOTE — Patient Instructions (Addendum)
Check the  blood pressure  BP GOAL is between 110/65 and  135/85. If it is consistently higher or lower, let me know     GO TO THE LAB : Get the blood work     [place], [place] back for a physical exam in 1 year     "Living will", "[place] of attorney": Advanced care planning  (If you already have a living will or healthcare power of attorney, please bring the copy to be scanned in your chart.)  Advance care planning is a process that supports adults in  understanding and sharing their preferences regarding future medical care.   The patient's preferences are recorded in documents called Advance Directives.    Advanced directives are completed (and can be modified at any time) while the patient is in full mental capacity.   The documentation should be available at all times to the patient, the family and the healthcare providers.  Bring in a copy to be scanned in your chart is an excellent idea and is recommended   This legal documents direct treatment decision making and/or appoint a surrogate to make the decision if the patient is not capable to do so.    Advance directives can be documented in many types of formats,  documents have names such as:  Lliving will  Durable power of attorney for healthcare (healthcare proxy or healthcare power of attorney)  Combined directives  Physician orders for life-sustaining treatment    More information at:  [email or web address]

## 2021-05-26 ENCOUNTER — Encounter: Payer: Self-pay | Admitting: Internal Medicine

## 2021-05-26 NOTE — Assessment & Plan Note (Signed)
Here for CPX Hyperglycemia: Labs HTN: On amlodipine, BP is okay today, recommend to check at home ED: New issue, no cardiovascular symptoms, rec a trial with sildenafil, how to use it, what to expect discussed with the patient. Social: Retired, doing great. RTC 1 year.

## 2021-05-26 NOTE — Assessment & Plan Note (Signed)
--  Td 2018 -Shingrex x2 (2020) -Had Covid shots x4  -Recommend yearly flu shot --CCS: scope 9-09 neg except for hemorrhoids; cscope 10-219, next per GI --DRE normal, no symptoms, check PSA --Diet-exercise: Rides his bike 4 times a week, eating healthy--Labs: CMP, FLP, CBC,TSH, PSA - POA discussed

## 2021-07-26 ENCOUNTER — Other Ambulatory Visit: Payer: Self-pay | Admitting: Internal Medicine

## 2021-12-04 DIAGNOSIS — R0981 Nasal congestion: Secondary | ICD-10-CM | POA: Diagnosis not present

## 2021-12-04 DIAGNOSIS — U071 COVID-19: Secondary | ICD-10-CM | POA: Diagnosis not present

## 2021-12-04 DIAGNOSIS — E663 Overweight: Secondary | ICD-10-CM | POA: Diagnosis not present

## 2021-12-04 DIAGNOSIS — I10 Essential (primary) hypertension: Secondary | ICD-10-CM | POA: Diagnosis not present

## 2021-12-06 DIAGNOSIS — R051 Acute cough: Secondary | ICD-10-CM | POA: Diagnosis not present

## 2021-12-06 DIAGNOSIS — U071 COVID-19: Secondary | ICD-10-CM | POA: Diagnosis not present

## 2022-01-14 ENCOUNTER — Encounter: Payer: Self-pay | Admitting: Gastroenterology

## 2022-02-26 ENCOUNTER — Ambulatory Visit (AMBULATORY_SURGERY_CENTER): Payer: BC Managed Care – PPO | Admitting: *Deleted

## 2022-02-26 VITALS — Ht 71.0 in | Wt 199.0 lb

## 2022-02-26 DIAGNOSIS — Z86010 Personal history of colonic polyps: Secondary | ICD-10-CM

## 2022-02-26 MED ORDER — NA SULFATE-K SULFATE-MG SULF 17.5-3.13-1.6 GM/177ML PO SOLN: 1.0000 | ORAL | 0 refills | Status: DC

## 2022-02-26 NOTE — Progress Notes (Signed)
Patient's pre-visit was done today over the phone with the patient. Name,DOB and address verified. Patient denies any allergies to Eggs and Soy. Patient denies any problems with anesthesia/sedation. Patient is not taking any diet pills or blood thinners. No home Oxygen. Insurance confirmed with patient. ? ?Prep instructions sent to pt's MyChart (if available) or -pt is aware. Patient understands to call us back with any questions or concerns. Patient is aware of our care-partner policy and LVDIX-18 safety protocol. Pt will use singlecare for suprep. ? ?EMMI education assigned to the patient for the procedure, sent to [facility].  ? ?The patient is COVID-19 vaccinated.   ?

## 2022-02-28 ENCOUNTER — Encounter: Payer: Self-pay | Admitting: Gastroenterology

## 2022-03-12 ENCOUNTER — Ambulatory Visit (AMBULATORY_SURGERY_CENTER): Payer: BC Managed Care – PPO | Admitting: Gastroenterology

## 2022-03-12 ENCOUNTER — Encounter: Payer: Self-pay | Admitting: Gastroenterology

## 2022-03-12 VITALS — BP 114/81 | HR 66 | Temp 98.9°F | Resp 8 | Ht 71.0 in | Wt 199.0 lb

## 2022-03-12 DIAGNOSIS — D123 Benign neoplasm of transverse colon: Secondary | ICD-10-CM

## 2022-03-12 DIAGNOSIS — D124 Benign neoplasm of descending colon: Secondary | ICD-10-CM | POA: Diagnosis not present

## 2022-03-12 DIAGNOSIS — Z86010 Personal history of colonic polyps: Secondary | ICD-10-CM | POA: Diagnosis not present

## 2022-03-12 DIAGNOSIS — Z1211 Encounter for screening for malignant neoplasm of colon: Secondary | ICD-10-CM | POA: Diagnosis not present

## 2022-03-12 MED ORDER — SODIUM CHLORIDE 0.9 % IV SOLN: 500.0000 mL | Freq: Once | INTRAVENOUS | Status: DC

## 2022-03-12 NOTE — Op Note (Signed)
[facility] ?Patient Name: Jacob Gould ?Procedure Date: 03/12/2022 7:53 AM ?MRN: [id number] ?Endoscopist: Ladene Artist , MD ?Age: 66 ?Referring MD:  ?Date of Birth: 28-Mar-1956 ?Gender: Male ?Account #: [id number] ?Procedure:                Colonoscopy ?Indications:              Surveillance: Personal history of adenomatous  ?                          polyps on last colonoscopy > 3 years ago ?Medicines:                Monitored Anesthesia Care ?Procedure:                Pre-Anesthesia Assessment: ?                          - Prior to the procedure, a History and Physical  ?                          was performed, and patient medications and  ?                          allergies were reviewed. The patient's tolerance of  ?                          previous anesthesia was also reviewed. The risks  ?                          and benefits of the procedure and the sedation  ?                          options and risks were discussed with the patient.  ?                          All questions were answered, and informed consent  ?                          was obtained. Prior Anticoagulants: The patient has  ?                          taken no previous anticoagulant or antiplatelet  ?                          agents. ASA Grade Assessment: II - A patient with  ?                          mild systemic disease. After reviewing the risks  ?                          and benefits, the patient was deemed in  ?                          satisfactory condition to undergo the procedure. ?  After obtaining informed consent, the colonoscope  ?                          was passed under direct vision. Throughout the  ?                          procedure, the patient's blood pressure, pulse, and  ?                          oxygen saturations were monitored continuously. The  ?                          CF [id number] #[id number] was introduced through the anus  ?                          and advanced to the  the cecum, identified by  ?                          appendiceal orifice and ileocecal valve. The  ?                          ileocecal valve, appendiceal orifice, and rectum  ?                          were photographed. The quality of the bowel  ?                          preparation was excellent. The colonoscopy was  ?                          performed without difficulty. The patient tolerated  ?                          the procedure well. ?Scope In: 8:04:36 AM ?Scope Out: 8:17:15 AM ?Scope Withdrawal Time: 0 hours 11 minutes 13 seconds  ?Total Procedure Duration: 0 hours 12 minutes 39 seconds  ?Findings:                 The perianal and digital rectal examinations were  ?                          normal. ?                          Two sessile polyps were found in the descending  ?                          colon and transverse colon. The polyps were 5 to 6  ?                          mm in size. These polyps were removed with a cold  ?                          snare. Resection and retrieval were complete. ?  Internal hemorrhoids were found during  ?                          retroflexion. The hemorrhoids were moderate and  ?                          Grade I (internal hemorrhoids that do not prolapse). ?                          The exam was otherwise without abnormality on  ?                          direct and retroflexion views. ?Complications:            No immediate complications. Estimated blood loss:  ?                          None. ?Estimated Blood Loss:     Estimated blood loss: none. ?Impression:               - Two 5 to 6 mm polyps in the descending colon and  ?                          in the transverse colon, removed with a cold snare.  ?                          Resected and retrieved. ?                          - Internal hemorrhoids. ?                          - The examination was otherwise normal on direct  ?                          and retroflexion  views. ?Recommendation:           - Repeat colonoscopy after studies are complete for  ?                          surveillance based on pathology results. ?                          - Patient has a contact number available for  ?                          emergencies. The signs and symptoms of potential  ?                          delayed complications were discussed with the  ?                          patient. Return to normal activities tomorrow.  ?                          Written discharge instructions were provided to the  ?  patient. ?                          - Resume previous diet. ?                          - Continue present medications. ?                          - Await pathology results. ?Ladene Artist, MD ?03/12/2022 8:20:32 AM ?This report has been signed electronically. ?

## 2022-03-12 NOTE — Progress Notes (Signed)
? ?History & Physical ? ?Primary Care Physician:  Jacob Branch, MD ?Primary Gastroenterologist: Jacob Edward, MD ? ?CHIEF COMPLAINT:  Personal history of Jacob polyps  ? ?HPI: Jacob Gould is a 66 y.o. male with a personal history of adenomatous Jacob polyps for colonoscopy. ? ? ?Past Medical History:  ?Diagnosis Date  ? Allergic rhinitis   ? Allergy   ? GERD (gastroesophageal reflux disease)   ? Hypertension   ? Low back pain   ? intermittent  ? ? ?Past Surgical History:  ?Procedure Laterality Date  ? COLONOSCOPY  09/07/2018  ? Dr.Forest Gould  ? toe injury  2015  ? R great toe, saw ortho  ? ? ?Prior to Admission medications   ?Medication Sig Start Date End Date Taking? Authorizing Provider  ?amLODipine (NORVASC) 10 MG tablet TAKE 1 TABLET BY MOUTH EVERY DAY 07/26/21  Yes Gould, Jacob Berthold, MD  ?fluticasone (FLONASE) 50 MCG/ACT nasal spray Place into both nostrils daily.   Yes [provider]  ?sildenafil (REVATIO) 20 MG tablet Take 3-4 tablets (60-80 mg total) by mouth at bedtime as needed. 05/25/21   Jacob Branch, MD  ? ? ?Current Outpatient Medications  ?Medication Sig Dispense Refill  ? amLODipine (NORVASC) 10 MG tablet TAKE 1 TABLET BY MOUTH EVERY DAY 90 tablet 3  ? fluticasone (FLONASE) 50 MCG/ACT nasal spray Place into both nostrils daily.    ? sildenafil (REVATIO) 20 MG tablet Take 3-4 tablets (60-80 mg total) by mouth at bedtime as needed. 30 tablet 3  ? ?Current Facility-Administered Medications  ?Medication Dose Route Frequency Provider Last Rate Last Admin  ? 0.9 %  sodium chloride infusion  500 mL Intravenous Once Jacob Artist, MD      ? ? ?Allergies as of 03/12/2022  ? (No Known Allergies)  ? ? ?Family History  ?Problem Relation Age of Onset  ? Hypertension Mother   ?     also GF  ? Pneumonia Father   ? Diabetes Neg Hx   ? Jacob cancer Neg Hx   ? Prostate cancer Neg Hx   ? Heart attack Neg Hx   ? Jacob polyps Neg Hx   ? Rectal cancer Neg Hx   ? Stomach cancer Neg Hx   ? Esophageal cancer Neg Hx    ? ? ?Social History  ? ?Socioeconomic History  ? Marital status: Married  ?  Spouse name: Not on file  ? Number of children: 3  ? Years of education: Not on file  ? Highest education level: Not on file  ?Occupational History  ? Occupation: retired Management consultant store  ?Tobacco Use  ? Smoking status: Former  ? Smokeless tobacco: Former  ? Tobacco comments:  ?  quit in the 90s  ?Substance and Sexual Activity  ? Alcohol use: Yes  ?  Alcohol/week: 7.0 standard drinks  ?  Types: 7 Glasses of wine per week  ?  Comment: wine at night   ? Drug use: No  ? Sexual activity: Not on file  ?Other Topics Concern  ? Not on file  ?Social History Narrative  ? 3 adult children, 3 G-kids   ? Youngest son is in [employer]  ? ?Social Determinants of Health  ? ?Financial Resource Strain: Not on file  ?Food Insecurity: Not on file  ?Transportation Needs: Not on file  ?Physical Activity: Not on file  ?Stress: Not on file  ?Social Connections: Not on file  ?Intimate Partner Violence: Not on file  ? ? ?  Review of Systems: ? ?All systems reviewed an negative except where noted in HPI. ? ?Gen: Denies any fever, chills, sweats, anorexia, fatigue, weakness, malaise, weight loss, and sleep disorder ?CV: Denies chest pain, angina, palpitations, syncope, orthopnea, PND, peripheral edema, and claudication. ?Resp: Denies dyspnea at rest, dyspnea with exercise, cough, sputum, wheezing, coughing up blood, and pleurisy. ?GI: Denies vomiting blood, jaundice, and fecal incontinence.   Denies dysphagia or odynophagia. ?GU : Denies urinary burning, blood in urine, urinary frequency, urinary hesitancy, nocturnal urination, and urinary incontinence. ?MS: Denies joint pain, limitation of movement, and swelling, stiffness, low back pain, extremity pain. Denies muscle weakness, cramps, atrophy.  ?Derm: Denies rash, itching, dry skin, hives, moles, warts, or unhealing ulcers.  ?Psych: Denies depression, anxiety, memory loss, suicidal ideation, hallucinations,  paranoia, and confusion. ?Heme: Denies bruising, bleeding, and enlarged lymph nodes. ?Neuro:  Denies any headaches, dizziness, paresthesias. ?Endo:  Denies any problems with DM, thyroid, adrenal function. ? ? ?Physical Exam: ?General:  Alert, well-developed, in NAD ?Head:  Normocephalic and atraumatic. ?Eyes:  Sclera clear, no icterus.   Conjunctiva pink. ?Ears:  Normal auditory acuity. ?Mouth:  No deformity or lesions.  ?Neck:  Supple; no masses . ?Lungs:  Clear throughout to auscultation.   No wheezes, crackles, or rhonchi. No acute distress. ?Heart:  Regular rate and rhythm; no murmurs. ?Abdomen:  Soft, nondistended, nontender. No masses, hepatomegaly. No obvious masses.  Normal bowel .    ?Rectal:  Deferred   ?Msk:  Symmetrical without gross deformities.Marland Kitchen ?Pulses:  Normal pulses noted. ?Extremities:  Without edema. ?Neurologic:  Alert and  oriented x4;  grossly normal neurologically. ?Skin:  Intact without significant lesions or rashes. ?Cervical Nodes:  No significant cervical adenopathy. ?Psych:  Alert and cooperative. Normal mood and affect. ? ?Impression / Plan:  ? ?Personal history of adenomatous Jacob polyps for colonoscopy. ? ?Jacob Gould Plan  03/12/2022, 8:01 AM ?See Jacob Gould, Jacob [facility], to contact our on call provider ? ? ?  ?

## 2022-03-12 NOTE — Progress Notes (Signed)
Called to room to assist during endoscopic procedure.  Patient ID and intended procedure confirmed with present staff. Received instructions for my participation in the procedure from the performing physician.  

## 2022-03-12 NOTE — Patient Instructions (Signed)
Resume previous medications.  2 polyps removed and sent to pathology.  Await results for final recommendations.  Handouts on findings given to patient.  (Hemorrhoids and polyps)  ? ?YOU HAD AN ENDOSCOPIC PROCEDURE TODAY AT [facility]:   Refer to the procedure report that was given to you for any specific questions about what was found during the examination.  If the procedure report does not answer your questions, please call your gastroenterologist to clarify.  If you requested that your care partner not be given the details of your procedure findings, then the procedure report has been included in a sealed envelope for you to review at your convenience later. ? ?YOU SHOULD EXPECT: Some feelings of bloating in the abdomen. Passage of more gas than usual.  Walking can help get rid of the air that was put into your GI tract during the procedure and reduce the bloating. If you had a lower endoscopy (such as a colonoscopy or flexible sigmoidoscopy) you may notice spotting of blood in your stool or on the toilet paper. If you underwent a bowel prep for your procedure, you may not have a normal bowel movement for a few days. ? ?Please Note:  You might notice some irritation and congestion in your nose or some drainage.  This is from the oxygen used during your procedure.  There is no need for concern and it should clear up in a day or so. ? ?SYMPTOMS TO REPORT IMMEDIATELY: ? ?Following lower endoscopy (colonoscopy or flexible sigmoidoscopy): ? Excessive amounts of blood in the stool ? Significant tenderness or worsening of abdominal pains ? Swelling of the abdomen that is new, acute ? Fever of 100?F or higher ? ? ?For urgent or emergent issues, a gastroenterologist can be reached at any hour by calling [phone]. ?Do not use MyChart messaging for urgent concerns.  ? ? ?DIET:  We do recommend a small meal at first, but then you may proceed to your regular diet.  Drink plenty of fluids but you should  avoid alcoholic beverages for 24 hours. ? ?ACTIVITY:  You should plan to take it easy for the rest of today and you should NOT DRIVE or use heavy machinery until tomorrow (because of the sedation medicines used during the test).   ? ?FOLLOW UP: ?Our staff will call the number listed on your records 48-72 hours following your procedure to check on you and address any questions or concerns that you may have regarding the information given to you following your procedure. If we do not reach you, we will leave a message.  We will attempt to reach you two times.  During this call, we will ask if you have developed any symptoms of COVID 19. If you develop any symptoms (ie: fever, flu-like symptoms, shortness of breath, cough etc.) before then, please call [phone].  If you test positive for Covid 19 in the 2 weeks post procedure, please call and report this information to [place].   ? ?If any biopsies were taken you will be contacted by phone or by letter within the next 1-3 weeks.  Please call us at [phone] if you have not heard about the biopsies in 3 weeks.  ? ? ?SIGNATURES/CONFIDENTIALITY: ?You and/or your care partner have signed paperwork which will be entered into your electronic medical record.  These signatures attest to the fact that that the information above on your After Visit Summary has been reviewed and is understood.  Full responsibility of the  confidentiality of this discharge information lies with you and/or your care-partner.  ?

## 2022-03-12 NOTE — Progress Notes (Signed)
Pt's states no medical or surgical changes since previsit or office visit. 

## 2022-03-12 NOTE — Progress Notes (Signed)
Report to PACU, RN, vss, BBS= Clear.  

## 2022-03-14 ENCOUNTER — Telehealth: Payer: Self-pay | Admitting: *Deleted

## 2022-03-14 NOTE — Telephone Encounter (Signed)
?  Follow up Call- ? ? ?  03/12/2022  ?  7:16 AM  ?Call back number  ?Post procedure Call Back phone  # [phone]  ?Permission to leave phone message Yes  ?  ? ?Patient questions: ? ?Do you have a fever, pain , or abdominal swelling? No. ?Pain Score  0 * ? ?Have you tolerated food without any problems? Yes.   ? ?Have you been able to return to your normal activities? Yes.   ? ?Do you have any questions about your discharge instructions: ?Diet   No. ?Medications  No. ?Follow up visit  No. ? ?Do you have questions or concerns about your Care? No. ? ?Actions: ?* If pain score is 4 or above: ?No action needed, pain <4. ? ? ?

## 2022-04-01 ENCOUNTER — Encounter: Payer: Self-pay | Admitting: Gastroenterology

## 2022-05-27 ENCOUNTER — Ambulatory Visit (INDEPENDENT_AMBULATORY_CARE_PROVIDER_SITE_OTHER): Payer: BC Managed Care – PPO | Admitting: Internal Medicine

## 2022-05-27 ENCOUNTER — Encounter: Payer: Self-pay | Admitting: Internal Medicine

## 2022-05-27 VITALS — BP 132/80 | HR 78 | Temp 97.7°F | Resp 16 | Ht 71.0 in | Wt 201.0 lb

## 2022-05-27 DIAGNOSIS — Z0000 Encounter for general adult medical examination without abnormal findings: Secondary | ICD-10-CM

## 2022-05-27 DIAGNOSIS — I10 Essential (primary) hypertension: Secondary | ICD-10-CM

## 2022-05-27 LAB — COMPREHENSIVE METABOLIC PANEL
ALT: 14 U/L (ref 0–53)
AST: 14 U/L (ref 0–37)
Albumin: 4.5 g/dL (ref 3.5–5.2)
Alkaline Phosphatase: 49 U/L (ref 39–117)
BUN: 16 mg/dL (ref 6–23)
CO2: 26 mEq/L (ref 19–32)
Calcium: 9.3 mg/dL (ref 8.4–10.5)
Chloride: 105 mEq/L (ref 96–112)
Creatinine, Ser: 0.96 mg/dL (ref 0.40–1.50)
GFR: 83.59 mL/min (ref >60.00)
Glucose, Bld: 99 mg/dL (ref 70–99)
Potassium: 4.2 mEq/L (ref 3.5–5.1)
Sodium: 139 mEq/L (ref 135–145)
Total Bilirubin: 1.8 mg/dL — ABNORMAL HIGH (ref 0.2–1.2)
Total Protein: 7.1 g/dL (ref 6.0–8.3)

## 2022-05-27 LAB — CBC WITH DIFFERENTIAL/PLATELET
Basophils Absolute: 0 10*3/uL (ref 0.0–0.1)
Basophils Relative: 0.6 % (ref 0.0–3.0)
Eosinophils Absolute: 0.1 10*3/uL (ref 0.0–0.7)
Eosinophils Relative: 3.2 % (ref 0.0–5.0)
HCT: 46.7 % (ref 39.0–52.0)
Hemoglobin: 15.8 g/dL (ref 13.0–17.0)
Lymphocytes Relative: 31 % (ref 12.0–46.0)
Lymphs Abs: 1.4 10*3/uL (ref 0.7–4.0)
MCHC: 33.8 g/dL (ref 30.0–36.0)
MCV: 90.5 fl (ref 78.0–100.0)
Monocytes Absolute: 0.4 10*3/uL (ref 0.1–1.0)
Monocytes Relative: 7.7 % (ref 3.0–12.0)
Neutro Abs: 2.7 10*3/uL (ref 1.4–7.7)
Neutrophils Relative %: 57.5 % (ref 43.0–77.0)
Platelets: 189 10*3/uL (ref 150.0–400.0)
RBC: 5.16 Mil/uL (ref 4.22–5.81)
RDW: 12.8 % (ref 11.5–15.5)
WBC: 4.6 10*3/uL (ref 4.0–10.5)

## 2022-05-27 LAB — PSA: PSA: 2.59 ng/mL (ref 0.10–4.00)

## 2022-05-27 LAB — LIPID PANEL
Cholesterol: 184 mg/dL (ref 0–200)
HDL: 51.1 mg/dL (ref >39.00)
LDL Cholesterol: 114 mg/dL — ABNORMAL HIGH (ref 0–99)
NonHDL: 133.09
Total CHOL/HDL Ratio: 4
Triglycerides: 93 mg/dL (ref 0.0–149.0)
VLDL: 18.6 mg/dL (ref 0.0–40.0)

## 2022-05-27 NOTE — Progress Notes (Signed)
Subjective:    Patient ID: Jacob Gould, male    DOB: September 25, 1956, 66 y.o.   MRN: [id number]  DOS:  05/27/2022 Type of visit - description: cpx  Since the last office visit is doing well and has no major concerns. He remains active and has healthy lifestyle  Review of Systems   A 14 point review of systems is negative    Past Medical History:  Diagnosis Date   Allergic rhinitis    Allergy    GERD (gastroesophageal reflux disease)    Hypertension    Low back pain    intermittent    Past Surgical History:  Procedure Laterality Date   COLONOSCOPY  09/07/2018   Dr.Stark   toe injury  2015   R great toe, saw ortho   Social History   Socioeconomic History   Marital status: Married    Spouse name: Not on file   Number of children: 3   Years of education: Not on file   Highest education level: Not on file  Occupational History   Occupation: retired Management consultant store    Comment: works part time  Tobacco Use   Smoking status: Former   Smokeless tobacco: Former   Tobacco comments:    quit in the 90s  Substance and Sexual Activity   Alcohol use: Yes    Alcohol/week: 7.0 standard drinks of alcohol    Types: 7 Glasses of wine per week    Comment: wine at night    Drug use: No   Sexual activity: Not on file  Other Topics Concern   Not on file  Social History Narrative   3 adult children, 3 G-kids    Youngest son is in Librarian, academic   Social Determinants of Radio broadcast assistant Strain: Not on file  Food Insecurity: Not on file  Transportation Needs: Not on file  Physical Activity: Not on file  Stress: Not on file  Social Connections: Not on file  Intimate Partner Violence: Not on file    Current Outpatient Medications  Medication Instructions   amLODipine (NORVASC) 10 MG tablet TAKE 1 TABLET BY MOUTH EVERY DAY   fluticasone (FLONASE) 50 MCG/ACT nasal spray Each Nare, Daily   sildenafil (REVATIO) 60-80 mg, Oral, At bedtime PRN       Objective:    Physical Exam BP 132/80   Pulse 78   Temp 97.7 F (36.5 C) (Oral)   Resp 16   Ht '5\' 11"'$  (1.803 m)   Wt 201 lb (91.2 kg)   SpO2 98%   BMI 28.03 kg/m  General: Well developed, NAD, BMI noted Neck: No  thyromegaly  HEENT:  Normocephalic . Face symmetric, atraumatic Lungs:  CTA B Normal respiratory effort, no intercostal retractions, no accessory muscle use. Heart: RRR,  no murmur.  Abdomen:  Not distended, soft, non-tender. No rebound or rigidity.   Lower extremities: no pretibial edema bilaterally  Skin: Exposed areas without rash. Not pale. Not jaundice Neurologic:  alert & oriented X3.  Speech normal, gait appropriate for age and unassisted Strength symmetric and appropriate for age.  Psych: Cognition and judgment appear intact.  Cooperative with normal attention span and concentration.  Behavior appropriate. No anxious or depressed appearing.     Assessment     Assessment Hyperglycemia  A1c 5.7 (2012) HTN GERD -- PPIs prn Occasional back pain Seasonal allergies   PLAN Here for CPX Hyperglycemia: Last A1c of 5.6. HTN: On amlodipine, ambulatory BPs reportedly normal.  No  change. Dyslipidemia: Calculated CV risk: 14.7%, he qualifies for statins despite normal LDL.  This was discussed with the patient, we are rechecking FLP, he would be willing to start statin (low-dose) RTC 1 year

## 2022-05-27 NOTE — Patient Instructions (Signed)
Check the  blood pressure regularly BP GOAL is between 110/65 and  135/85. If it is consistently higher or lower, let me know     GO TO THE LAB : Get the blood work     GO TO THE FRONT DESK, PLEASE SCHEDULE YOUR APPOINTMENTS Come back for a physical exam in 1 year    "Living will", "Health Care Power of attorney": Advanced care planning  (If you already have a living will or healthcare power of attorney, please bring the copy to be scanned in your chart.)  Advance care planning is a process that supports adults in  understanding and sharing their preferences regarding future medical care.   The patient's preferences are recorded in documents called Advance Directives.    Advanced directives are completed (and can be modified at any time) while the patient is in full mental capacity.   The documentation should be available at all times to the patient, the family and the healthcare providers.  Bring in a copy to be scanned in your chart is an excellent idea and is recommended   This legal documents direct treatment decision making and/or appoint a surrogate to make the decision if the patient is not capable to do so.    Advance directives can be documented in many types of formats,  documents have names such as:  Lliving will  Durable power of attorney for healthcare (healthcare proxy or healthcare power of attorney)  Combined directives  Physician orders for life-sustaining treatment    More information at:  [email or web address] 

## 2022-05-27 NOTE — Assessment & Plan Note (Signed)
--  Td 2018 -Shingrex x2 (2020) -Had Covid shots: UTD, s/p bivalent vax 07-2021, had covid 11-2021.  Next vaccine at age 66 -Recommend yearly flu shot --CCS: scope 9-09 neg except for hemorrhoids; cscope 10-219, C-scope 02/2022, next 2030 per GI letter --DRE normal last year, PSA went from 1.6 to 2.4.  Recheck PSA.  No symptoms. --Diet-exercise: bike ride 4 times a week, eating healthy --Labs: CMP, FLP, CBC, PSA - POA: Information provided

## 2022-05-27 NOTE — Assessment & Plan Note (Signed)
Here for CPX Hyperglycemia: Last A1c of 5.6. HTN: On amlodipine, ambulatory BPs reportedly normal.  No change. Dyslipidemia: Calculated CV risk: 14.7%, he qualifies for statins despite normal LDL.  This was discussed with the patient, we are rechecking FLP, he would be willing to start statin (low-dose) RTC 1 year

## 2022-05-31 ENCOUNTER — Other Ambulatory Visit: Payer: Self-pay | Admitting: *Deleted

## 2022-05-31 DIAGNOSIS — E7800 Pure hypercholesterolemia, unspecified: Secondary | ICD-10-CM

## 2022-05-31 MED ORDER — ATORVASTATIN CALCIUM 10 MG PO TABS: 10.0000 mg | ORAL_TABLET | Freq: Every evening | ORAL | 0 refills | Status: DC

## 2022-06-18 ENCOUNTER — Encounter: Payer: Self-pay | Admitting: Internal Medicine

## 2022-06-18 MED ORDER — SILDENAFIL CITRATE 20 MG PO TABS: 60.0000 mg | ORAL_TABLET | Freq: Every evening | ORAL | 3 refills | Status: AC | PRN

## 2022-07-10 ENCOUNTER — Other Ambulatory Visit: Payer: Self-pay | Admitting: Internal Medicine

## 2022-08-30 ENCOUNTER — Other Ambulatory Visit: Payer: Self-pay | Admitting: Internal Medicine

## 2022-09-09 ENCOUNTER — Other Ambulatory Visit (INDEPENDENT_AMBULATORY_CARE_PROVIDER_SITE_OTHER): Payer: BC Managed Care – PPO

## 2022-09-09 DIAGNOSIS — E7800 Pure hypercholesterolemia, unspecified: Secondary | ICD-10-CM

## 2022-09-09 LAB — ALT: ALT: 16 U/L (ref 0–53)

## 2022-09-09 LAB — LIPID PANEL
Cholesterol: 120 mg/dL (ref 0–200)
HDL: 44.5 mg/dL (ref >39.00)
LDL Cholesterol: 59 mg/dL (ref 0–99)
NonHDL: 75.61
Total CHOL/HDL Ratio: 3
Triglycerides: 83 mg/dL (ref 0.0–149.0)
VLDL: 16.6 mg/dL (ref 0.0–40.0)

## 2022-09-09 LAB — AST: AST: 14 U/L (ref 0–37)

## 2022-09-11 ENCOUNTER — Telehealth: Payer: BC Managed Care – PPO | Admitting: Family

## 2022-09-11 DIAGNOSIS — R399 Unspecified symptoms and signs involving the genitourinary system: Secondary | ICD-10-CM

## 2022-09-11 MED ORDER — ATORVASTATIN CALCIUM 10 MG PO TABS: 10.0000 mg | ORAL_TABLET | Freq: Every day | ORAL | 3 refills | Status: DC

## 2022-09-11 NOTE — Progress Notes (Signed)
E-Visit for Urinary Problems ? ?Based on what you shared with me, I feel your condition warrants further evaluation and I recommend that you be seen for a face to face office visit.  Male bladder infections are not very common.  We worry about prostate or kidney conditions.  The standard of care is to examine the abdomen and kidneys, and to do a urine and blood test to make sure that something more serious is not going on.  We recommend that you see a provider today.  If your doctor's office is closed [facility] has the following Urgent Cares: ? ?  ?NOTE: You will not be charged for this e-visit. ? ?If you are having a true medical emergency please call 911.   ? ?  ? For an urgent face to face visit, [facility] has six urgent care centers for your convenience:  ?  ? [facility] [place] ?Get Driving Directions ?[phone] ?[address] ?[place], [place] [address] ?  ? [facility] ([place]) ?Get Driving Directions ?[phone] ?[address] ?[place], [place] [address] ? ?[facility] [facility] [place]) ?Get Driving Directions ?[phone] ?[address] ?[place],  [place]  [address] ? ?[facility] ?Get Driving Directions ?[phone] ?[address] ?[place], [place] [address] ?  ?[facility] [place] ?Get Driving Directions  ?[phone] ?[address] ?[address] ?[place], [place] [address] ?  ?[facility] [place] [facility] ?Get Driving Directions ?[phone] ?[address] ?[place],  [address] ? ?Your MyChart E-visit questionnaire answers were reviewed by a board certified advanced clinical practitioner to complete your personal care plan based on your specific symptoms.  Thank you for using e-Visits. ?

## 2022-09-11 NOTE — Addendum Note (Signed)
Addended byDamita Dunnings D on: 09/11/2022 01:56 PM   Modules accepted: Orders

## 2022-09-12 ENCOUNTER — Ambulatory Visit (INDEPENDENT_AMBULATORY_CARE_PROVIDER_SITE_OTHER): Payer: BC Managed Care – PPO | Admitting: Internal Medicine

## 2022-09-12 ENCOUNTER — Encounter: Payer: Self-pay | Admitting: Internal Medicine

## 2022-09-12 VITALS — BP 138/90 | HR 87 | Temp 98.1°F | Resp 16 | Ht 71.0 in | Wt 203.1 lb

## 2022-09-12 DIAGNOSIS — N429 Disorder of prostate, unspecified: Secondary | ICD-10-CM

## 2022-09-12 DIAGNOSIS — R399 Unspecified symptoms and signs involving the genitourinary system: Secondary | ICD-10-CM

## 2022-09-12 DIAGNOSIS — R319 Hematuria, unspecified: Secondary | ICD-10-CM | POA: Diagnosis not present

## 2022-09-12 LAB — POC URINALSYSI DIPSTICK (AUTOMATED)
Bilirubin, UA: NEGATIVE
Blood, UA: NEGATIVE
Glucose, UA: NEGATIVE
Ketones, UA: NEGATIVE
Leukocytes, UA: NEGATIVE
Nitrite, UA: NEGATIVE
Protein, UA: NEGATIVE
Spec Grav, UA: <=1.005 — AB (ref 1.010–1.025)
Urobilinogen, UA: 0.2 E.U./dL
pH, UA: 6.5 (ref 5.0–8.0)

## 2022-09-12 LAB — URINALYSIS, ROUTINE W REFLEX MICROSCOPIC
Bilirubin Urine: NEGATIVE
Ketones, ur: NEGATIVE
Leukocytes,Ua: NEGATIVE
Nitrite: NEGATIVE
Specific Gravity, Urine: <=1.005 — AB (ref 1.000–1.030)
Total Protein, Urine: NEGATIVE
Urine Glucose: NEGATIVE
Urobilinogen, UA: 0.2 (ref 0.0–1.0)
WBC, UA: NONE SEEN (ref 0–?)
pH: 7 (ref 5.0–8.0)

## 2022-09-12 LAB — PSA: PSA: 3.44 ng/mL (ref 0.10–4.00)

## 2022-09-12 NOTE — Patient Instructions (Signed)
Check the  blood pressure regularly BP GOAL is between 110/65 and  135/85. If it is consistently higher or lower, let me know  We will refer you to urology  May need to send you some medication based on the results.   GO TO THE LAB : Get the blood work

## 2022-09-12 NOTE — Progress Notes (Signed)
   Subjective:    Patient ID: Jacob Gould, male    DOB: 28-Oct-1956, 66 y.o.   MRN: [id number]  DOS:  09/12/2022 Type of visit - description: Acute  1 year history of nocturia, initially 1 time at night, it has gradually gotten worse. In the last few days had to urinate at night 4-5 times. Also has developed some dysuria x2 days, + urinary frequency and a feeling of incomplete bladder emptying.  Denies fever chills No nausea or vomiting No suprapubic pain or flank pain. No gross hematuria Some difficulty urinating. Denies any penile discharge or STD risk.   Review of Systems See above   Past Medical History:  Diagnosis Date   Allergic rhinitis    Allergy    GERD (gastroesophageal reflux disease)    Hypertension    Low back pain    intermittent    Past Surgical History:  Procedure Laterality Date   COLONOSCOPY  09/07/2018   Dr.Stark   toe injury  2015   R great toe, saw ortho    Current Outpatient Medications  Medication Instructions   amLODipine (NORVASC) 10 MG tablet TAKE 1 TABLET BY MOUTH EVERY DAY   atorvastatin (LIPITOR) 10 mg, Oral, Daily at bedtime   fluticasone (FLONASE) 50 MCG/ACT nasal spray Each Nare, Daily   sildenafil (REVATIO) 60-80 mg, Oral, At bedtime PRN       Objective:   Physical Exam BP (!) 138/90   Pulse 87   Temp 98.1 F (36.7 C) (Oral)   Resp 16   Ht '5\' 11"'$  (1.803 m)   Wt 203 lb 2 oz (92.1 kg)   SpO2 96%   BMI 28.33 kg/m  General:   Well developed, NAD, BMI noted.  HEENT:  Normocephalic . Face symmetric, atraumatic Abdomen:  Not distended, soft, non-tender. No rebound or rigidity. No CVA tenderness DRE: External skin tags/hemorrhoids, normal sphincter tone, no stools. Prostate is asymmetric, larger on the right, it is however not nodular or particularly tender Skin: Not pale. Not jaundice Lower extremities: no pretibial edema bilaterally  Neurologic:  alert & oriented X3.  Speech normal, gait appropriate for age and  unassisted Psych--  Cognition and judgment appear intact.  Cooperative with normal attention span and concentration.  Behavior appropriate. No anxious or depressed appearing.     Assessment    Assessment Hyperglycemia  A1c 5.7 (2012) HTN GERD -- PPIs prn Occasional back pain Seasonal allergies   PLAN Nocturia, asymmetric prostate: Gradual symptoms over the last year, nocturia getting slowly progressively more frequent. Some  dysuria x 2 days . DRE show a asymmetric prostate. Udip neg D/t the combination of LUTS and abnormal prostate exam needs further evaluation. UA, urine culture, PSA.  Consider antibiotics if appropriate. Urology referral.

## 2022-09-12 NOTE — Assessment & Plan Note (Signed)
PLAN Nocturia, asymmetric prostate: Gradual symptoms over the last year, nocturia getting slowly progressively more frequent. Some  dysuria x 2 days . DRE show a asymmetric prostate. Udip neg D/t the combination of LUTS and abnormal prostate exam needs further evaluation. UA, urine culture, PSA.  Consider antibiotics if appropriate. Urology referral.

## 2022-09-13 LAB — URINE CULTURE
MICRO NUMBER:: 14073698
Result:: NO GROWTH
SPECIMEN QUALITY:: ADEQUATE

## 2022-09-16 ENCOUNTER — Encounter: Payer: Self-pay | Admitting: Internal Medicine

## 2022-09-16 MED ORDER — TAMSULOSIN HCL 0.4 MG PO CAPS: 0.4000 mg | ORAL_CAPSULE | Freq: Every day | ORAL | 1 refills | Status: DC

## 2022-09-16 NOTE — Addendum Note (Signed)
Addended byDamita Dunnings D on: 09/16/2022 02:45 PM   Modules accepted: Orders

## 2022-10-10 ENCOUNTER — Other Ambulatory Visit: Payer: Self-pay | Admitting: Internal Medicine

## 2022-11-04 ENCOUNTER — Encounter: Payer: Self-pay | Admitting: Urology

## 2022-11-04 ENCOUNTER — Ambulatory Visit (INDEPENDENT_AMBULATORY_CARE_PROVIDER_SITE_OTHER): Payer: BC Managed Care – PPO | Admitting: Urology

## 2022-11-04 VITALS — BP 155/95 | HR 82 | Ht 71.0 in | Wt 200.0 lb

## 2022-11-04 DIAGNOSIS — N401 Enlarged prostate with lower urinary tract symptoms: Secondary | ICD-10-CM | POA: Diagnosis not present

## 2022-11-04 DIAGNOSIS — N138 Other obstructive and reflux uropathy: Secondary | ICD-10-CM | POA: Diagnosis not present

## 2022-11-04 DIAGNOSIS — R339 Retention of urine, unspecified: Secondary | ICD-10-CM | POA: Diagnosis not present

## 2022-11-04 DIAGNOSIS — R972 Elevated prostate specific antigen [PSA]: Secondary | ICD-10-CM | POA: Diagnosis not present

## 2022-11-04 LAB — URINALYSIS
Bilirubin, UA: NEGATIVE
Blood, UA: NEGATIVE
Glucose, UA: NEGATIVE mg/dL
Ketones, UA: NEGATIVE
Leukocytes, UA: NEGATIVE
Nitrite, UA: NEGATIVE
Protein, UA: NEGATIVE
Spec Grav, UA: 1.01 (ref 1.010–1.025)
Urobilinogen, UA: 0.2 E.U./dL
pH, UA: 6.5 (ref 5.0–8.0)

## 2022-11-04 LAB — BLADDER SCAN AMB NON-IMAGING

## 2022-11-04 NOTE — Progress Notes (Signed)
Assessment: 1. BPH with obstruction/lower urinary tract symptoms   2. Rising PSA level   3. Incomplete bladder emptying     Plan: I reviewed the patient's chart including provider notes and lab results. Continue tamsulosin Discussed potential significance of rising PSA. Return to office in 6 weeks  Repeat bladder scan and PSA next visit   Chief Complaint:  Chief Complaint  Patient presents with   Benign Prostatic Hypertrophy    History of Present Illness:  Jacob Gould is a 66 y.o. male who is seen in consultation from Colon Branch, MD for evaluation of abnormal DRE and lower urinary tract symptoms.  He noted worsening of his urinary symptoms in October 2023.  He was having increased nocturia 5-6 times per night and some dysuria.  He also noted some intermittency and decreased stream.  He was noted to have some asymmetry on prostate exam at that time.  Urinalysis was unremarkable.  Urine culture showed no growth. He was started on tamsulosin 0.4 mg daily.  He has noted improvement in his urinary symptoms with decreased nocturia to 1-2 times per night.   IPSS = 14 today.  PSA results: 6/20 1.66 7/22 2.41 7/23 2.59 10/23 3.44  No history of elevated PSA.  No history of UTI or prostatitis.  No prior prostate biopsy.  He has a history of a kidney stone, passing the stone spontaneously approximately 3 years ago.  No recent stone symptoms.  He has a history of erectile dysfunction and uses sildenafil as needed with good results.  Past Medical History:  Past Medical History:  Diagnosis Date   Allergic rhinitis    Allergy    GERD (gastroesophageal reflux disease)    Hypertension    Low back pain    intermittent    Past Surgical History:  Past Surgical History:  Procedure Laterality Date   COLONOSCOPY  09/07/2018   Dr.Stark   toe injury  2015   R great toe, saw ortho    Allergies:  No Known Allergies  Family History:  Family History  Problem Relation Age  of Onset   Hypertension Mother        also GF   Pneumonia Father    Diabetes Neg Hx    Colon cancer Neg Hx    Prostate cancer Neg Hx    Heart attack Neg Hx    Colon polyps Neg Hx    Rectal cancer Neg Hx    Stomach cancer Neg Hx    Esophageal cancer Neg Hx     Social History:  Social History   Tobacco Use   Smoking status: Former   Smokeless tobacco: Former   Tobacco comments:    quit in the 90s  Substance Use Topics   Alcohol use: Yes    Alcohol/week: 7.0 standard drinks of alcohol    Types: 7 Glasses of wine per week    Comment: wine at night    Drug use: No    Review of symptoms:  Constitutional:  Negative for unexplained weight loss, night sweats, fever, chills ENT:  Negative for nose bleeds, sinus pain, painful swallowing CV:  Negative for chest pain, shortness of breath, exercise intolerance, palpitations, loss of consciousness Resp:  Negative for cough, wheezing, shortness of breath GI:  Negative for nausea, vomiting, diarrhea, bloody stools GU:  Positives noted in HPI; otherwise negative for gross hematuria, dysuria, urinary incontinence Neuro:  Negative for seizures, poor balance, limb weakness, slurred speech Psych:  Negative for lack  of energy, depression, anxiety Endocrine:  Negative for polydipsia, polyuria, symptoms of hypoglycemia (dizziness, hunger, sweating) Hematologic:  Negative for anemia, purpura, petechia, prolonged or excessive bleeding, use of anticoagulants  Allergic:  Negative for difficulty breathing or choking as a result of exposure to anything; no shellfish allergy; no allergic response (rash/itch) to materials, foods  Physical exam: BP (!) 155/95   Pulse 82   Ht '5\' 11"'$  (1.803 m)   Wt 200 lb (90.7 kg)   BMI 27.89 kg/m  GENERAL APPEARANCE:  Well appearing, well developed, well nourished, NAD HEENT: Atraumatic, Normocephalic, oropharynx clear. NECK: Supple without lymphadenopathy or thyromegaly. LUNGS: Clear to auscultation  bilaterally. HEART: Regular Rate and Rhythm without murmurs, gallops, or rubs. ABDOMEN: Soft, non-tender, No Masses. EXTREMITIES: Moves all extremities well.  Without clubbing, cyanosis, or edema. NEUROLOGIC:  Alert and oriented x 3, normal gait, CN II-XII grossly intact.  MENTAL STATUS:  Appropriate. BACK:  Non-tender to palpation.  No CVAT SKIN:  Warm, dry and intact.   GU: Penis:  circumcised Meatus: Normal Scrotum: normal, no masses Testis: normal without masses bilateral Epididymis: normal Prostate: 40 g, NT, no nodules, right lobe slightly larger than left Rectum: Normal tone,  no masses or tenderness   Results: U/A dipstick: Negative  PVR = 274 ml

## 2022-11-04 NOTE — Progress Notes (Signed)
post void residual 226m

## 2022-12-16 ENCOUNTER — Encounter: Payer: Self-pay | Admitting: Urology

## 2022-12-16 ENCOUNTER — Ambulatory Visit: Payer: BC Managed Care – PPO | Admitting: Urology

## 2022-12-16 VITALS — BP 165/95 | HR 83 | Ht 71.0 in | Wt 205.0 lb

## 2022-12-16 DIAGNOSIS — N138 Other obstructive and reflux uropathy: Secondary | ICD-10-CM

## 2022-12-16 DIAGNOSIS — R339 Retention of urine, unspecified: Secondary | ICD-10-CM | POA: Diagnosis not present

## 2022-12-16 DIAGNOSIS — R972 Elevated prostate specific antigen [PSA]: Secondary | ICD-10-CM

## 2022-12-16 DIAGNOSIS — N401 Enlarged prostate with lower urinary tract symptoms: Secondary | ICD-10-CM

## 2022-12-16 LAB — URINALYSIS
Bilirubin, UA: NEGATIVE
Glucose, UA: NEGATIVE mg/dL
Ketones, POC UA: NEGATIVE mg/dL
Leukocytes, UA: NEGATIVE
Nitrite, UA: NEGATIVE
Protein Ur, POC: NEGATIVE mg/dL
Spec Grav, UA: <=1.005 — AB (ref 1.010–1.025)
Urobilinogen, UA: 0.2 E.U./dL
pH, UA: 6 (ref 5.0–8.0)

## 2022-12-16 LAB — BLADDER SCAN AMB NON-IMAGING

## 2022-12-16 MED ORDER — TAMSULOSIN HCL 0.4 MG PO CAPS: 0.4000 mg | ORAL_CAPSULE | Freq: Every day | ORAL | 2 refills | Status: DC

## 2022-12-16 NOTE — Progress Notes (Signed)
Assessment: 1. BPH with obstruction/lower urinary tract symptoms   2. Rising PSA level   3. Incomplete bladder emptying     Plan: Continue tamsulosin PSA today - will call with results Return to office in 3 months   Chief Complaint:  Chief Complaint  Patient presents with   Benign Prostatic Hypertrophy    History of Present Illness:  Jacob Gould is a 67 y.o. male who is seen for continued evaluation of abnormal DRE and lower urinary tract symptoms.  He noted worsening of his urinary symptoms in October 2023.  He was having increased nocturia 5-6 times per night and some dysuria.  He also noted some intermittency and decreased stream.  He was noted to have some asymmetry on prostate exam at that time.  Urinalysis was unremarkable.  Urine culture showed no growth. He was started on tamsulosin 0.4 mg daily with improvement in his urinary symptoms with decreased nocturia to 1-2 times per night.   IPSS = 14. PVR = 274 ml DRE showed asymmetry with R>L.  PSA results: 6/20 1.66 7/22 2.41 7/23 2.59 10/23 3.44  No history of elevated PSA.  No history of UTI or prostatitis.  No prior prostate biopsy.  He has a history of a kidney stone, passing the stone spontaneously approximately 3 years ago.  No recent stone symptoms.  He has a history of erectile dysfunction and uses sildenafil as needed with good results.  He returns today for scheduled follow-up.  He continues on tamsulosin.  He has noted improvement in his urinary symptoms with an improved urinary flow and decreased nocturia.  He currently has nocturia x 2.  No side effects from the medication.  No dysuria or gross hematuria. IPSS = 10 today.  Portions of the above documentation were copied from a prior visit for review purposes only.   Past Medical History:  Past Medical History:  Diagnosis Date   Allergic rhinitis    Allergy    GERD (gastroesophageal reflux disease)    Hypertension    Low back pain     intermittent    Past Surgical History:  Past Surgical History:  Procedure Laterality Date   COLONOSCOPY  09/07/2018   Dr.Stark   toe injury  2015   R great toe, saw ortho    Allergies:  No Known Allergies  Family History:  Family History  Problem Relation Age of Onset   Hypertension Mother        also GF   Pneumonia Father    Diabetes Neg Hx    Colon cancer Neg Hx    Prostate cancer Neg Hx    Heart attack Neg Hx    Colon polyps Neg Hx    Rectal cancer Neg Hx    Stomach cancer Neg Hx    Esophageal cancer Neg Hx     Social History:  Social History   Tobacco Use   Smoking status: Former   Smokeless tobacco: Former   Tobacco comments:    quit in the 90s  Substance Use Topics   Alcohol use: Yes    Alcohol/week: 7.0 standard drinks of alcohol    Types: 7 Glasses of wine per week    Comment: wine at night    Drug use: No    ROS: Constitutional:  Negative for fever, chills, weight loss CV: Negative for chest pain, previous MI, hypertension Respiratory:  Negative for shortness of breath, wheezing, sleep apnea, frequent cough GI:  Negative for nausea, vomiting, bloody stool,  GERD  Physical exam: BP (!) 165/95   Pulse 83   Ht '5\' 11"'$  (1.803 m)   Wt 205 lb (93 kg)   BMI 28.59 kg/m  GENERAL APPEARANCE:  Well appearing, well developed, well nourished, NAD HEENT:  Atraumatic, normocephalic, oropharynx clear NECK:  Supple without lymphadenopathy or thyromegaly ABDOMEN:  Soft, non-tender, no masses EXTREMITIES:  Moves all extremities well, without clubbing, cyanosis, or edema NEUROLOGIC:  Alert and oriented x 3, normal gait, CN II-XII grossly intact MENTAL STATUS:  appropriate BACK:  Non-tender to palpation, No CVAT SKIN:  Warm, dry, and intact   Results: U/A dipstick: trace blood  PVR: 146 ml

## 2022-12-17 LAB — PSA: Prostate Specific Ag, Serum: 2.1 ng/mL (ref 0.0–4.0)

## 2023-01-06 ENCOUNTER — Other Ambulatory Visit: Payer: Self-pay | Admitting: Internal Medicine

## 2023-01-06 DIAGNOSIS — N138 Other obstructive and reflux uropathy: Secondary | ICD-10-CM

## 2023-03-17 ENCOUNTER — Encounter: Payer: Self-pay | Admitting: Urology

## 2023-03-17 ENCOUNTER — Ambulatory Visit: Payer: Medicare Other | Admitting: Urology

## 2023-03-17 VITALS — BP 133/86 | HR 84 | Ht 71.0 in | Wt 200.0 lb

## 2023-03-17 DIAGNOSIS — N401 Enlarged prostate with lower urinary tract symptoms: Secondary | ICD-10-CM | POA: Diagnosis not present

## 2023-03-17 DIAGNOSIS — R339 Retention of urine, unspecified: Secondary | ICD-10-CM | POA: Diagnosis not present

## 2023-03-17 DIAGNOSIS — N138 Other obstructive and reflux uropathy: Secondary | ICD-10-CM | POA: Diagnosis not present

## 2023-03-17 DIAGNOSIS — R351 Nocturia: Secondary | ICD-10-CM

## 2023-03-17 LAB — MICROSCOPIC EXAMINATION
Cast Type: NONE SEEN
Casts: NONE SEEN /lpf
Crystal Type: NONE SEEN
Crystals: NONE SEEN
Epithelial Cells (non renal): NONE SEEN /hpf (ref 0–10)
Mucus, UA: NONE SEEN
Renal Epithel, UA: NONE SEEN /hpf
Trichomonas, UA: NONE SEEN
Yeast, UA: NONE SEEN

## 2023-03-17 LAB — BLADDER SCAN AMB NON-IMAGING

## 2023-03-17 LAB — URINALYSIS, ROUTINE W REFLEX MICROSCOPIC
Bilirubin, UA: NEGATIVE
Glucose, UA: NEGATIVE
Ketones, UA: NEGATIVE
Leukocytes,UA: NEGATIVE
Nitrite, UA: NEGATIVE
Protein,UA: NEGATIVE
Specific Gravity, UA: 1.015 (ref 1.005–1.030)
Urobilinogen, Ur: 1 mg/dL (ref 0.2–1.0)
pH, UA: 6 (ref 5.0–7.5)

## 2023-03-17 MED ORDER — MIRABEGRON ER 25 MG PO TB24: 25.0000 mg | ORAL_TABLET | Freq: Every day | ORAL | 0 refills | Status: DC

## 2023-03-17 NOTE — Progress Notes (Signed)
Assessment: 1. BPH with obstruction/lower urinary tract symptoms   2. Incomplete bladder emptying   3. Nocturia     Plan: Continue tamsulosin 0.4 mg daily Trial of Myrbetriq 25 mg nightly - samples provided I advised him to contact me with results of medication trial Return to office in 3 months   Chief Complaint:  Chief Complaint  Patient presents with   Benign Prostatic Hypertrophy    History of Present Illness:  Jacob Gould is a 67 y.o. male who is seen for continued evaluation of abnormal DRE and lower urinary tract symptoms.  He noted worsening of his urinary symptoms in October 2023.  He was having increased nocturia 5-6 times per night and some dysuria.  He also noted some intermittency and decreased stream.  He was noted to have some asymmetry on prostate exam at that time.  Urinalysis was unremarkable.  Urine culture showed no growth. He was started on tamsulosin 0.4 mg daily with improvement in his urinary symptoms with decreased nocturia to 1-2 times per night.   IPSS = 14. PVR = 274 ml DRE showed asymmetry with R>L.  PSA results: 6/20 1.66 7/22 2.41 7/23 2.59 10/23 3.44 1/24 2.1  No history of elevated PSA.  No history of UTI or prostatitis.  No prior prostate biopsy.  He has a history of a kidney stone, passing the stone spontaneously approximately 3 years ago.  No recent stone symptoms.  He has a history of erectile dysfunction and uses sildenafil as needed with good results.  At his visit in 1/24, he continued on tamsulosin with improvement in his urinary symptoms with an improved urinary flow and decreased nocturia.  He continued with nocturia x 2.  No side effects from the medication.  No dysuria or gross hematuria. IPSS = 10. PVR = 146 ml.  He returns today for follow-up.  He continues on tamsulosin 0.4 mg daily.  He reports that his urinary symptoms are fairly stable.  He continues to be bothered by nocturia 1-3 times per night.  No dysuria or  gross hematuria. IPSS = 19 today.  Portions of the above documentation were copied from a prior visit for review purposes only.   Past Medical History:  Past Medical History:  Diagnosis Date   Allergic rhinitis    Allergy    GERD (gastroesophageal reflux disease)    Hypertension    Low back pain    intermittent    Past Surgical History:  Past Surgical History:  Procedure Laterality Date   COLONOSCOPY  09/07/2018   Dr.Stark   toe injury  2015   R great toe, saw ortho    Allergies:  No Known Allergies  Family History:  Family History  Problem Relation Age of Onset   Hypertension Mother        also GF   Pneumonia Father    Diabetes Neg Hx    Colon cancer Neg Hx    Prostate cancer Neg Hx    Heart attack Neg Hx    Colon polyps Neg Hx    Rectal cancer Neg Hx    Stomach cancer Neg Hx    Esophageal cancer Neg Hx     Social History:  Social History   Tobacco Use   Smoking status: Former   Smokeless tobacco: Former   Tobacco comments:    quit in the 90s  Substance Use Topics   Alcohol use: Yes    Alcohol/week: 7.0 standard drinks of alcohol  Types: 7 Glasses of wine per week    Comment: wine at night    Drug use: No    ROS: Constitutional:  Negative for fever, chills, weight loss CV: Negative for chest pain, previous MI, hypertension Respiratory:  Negative for shortness of breath, wheezing, sleep apnea, frequent cough GI:  Negative for nausea, vomiting, bloody stool, GERD  Physical exam: BP 133/86   Pulse 84   Ht  (1.803 m)   Wt 200 lb (90.7 kg)   BMI 27.89 kg/m  GENERAL APPEARANCE:  Well appearing, well developed, well nourished, NAD HEENT:  Atraumatic, normocephalic, oropharynx clear NECK:  Supple without lymphadenopathy or thyromegaly ABDOMEN:  Soft, non-tender, no masses EXTREMITIES:  Moves all extremities well, without clubbing, cyanosis, or edema NEUROLOGIC:  Alert and oriented x 3, normal gait, CN II-XII grossly intact MENTAL  STATUS:  appropriate BACK:  Non-tender to palpation, No CVAT SKIN:  Warm, dry, and intact   Results: U/A: 0-5 WBC, 0-2 RBC, few bacteria  PVR: 21 ml

## 2023-06-02 ENCOUNTER — Ambulatory Visit (INDEPENDENT_AMBULATORY_CARE_PROVIDER_SITE_OTHER): Payer: Medicare Other | Admitting: Internal Medicine

## 2023-06-02 ENCOUNTER — Encounter: Payer: Self-pay | Admitting: Internal Medicine

## 2023-06-02 VITALS — BP 126/62 | HR 87 | Temp 98.1°F | Resp 16 | Ht 71.0 in | Wt 202.4 lb

## 2023-06-02 DIAGNOSIS — R739 Hyperglycemia, unspecified: Secondary | ICD-10-CM

## 2023-06-02 DIAGNOSIS — Z0000 Encounter for general adult medical examination without abnormal findings: Secondary | ICD-10-CM

## 2023-06-02 DIAGNOSIS — I10 Essential (primary) hypertension: Secondary | ICD-10-CM

## 2023-06-02 LAB — LIPID PANEL
Cholesterol: 134 mg/dL (ref 0–200)
HDL: 48.9 mg/dL (ref >39.00)
LDL Cholesterol: 69 mg/dL (ref 0–99)
NonHDL: 85.08
Total CHOL/HDL Ratio: 3
Triglycerides: 80 mg/dL (ref 0.0–149.0)
VLDL: 16 mg/dL (ref 0.0–40.0)

## 2023-06-02 LAB — COMPREHENSIVE METABOLIC PANEL
ALT: 14 U/L (ref 0–53)
AST: 14 U/L (ref 0–37)
Albumin: 4.3 g/dL (ref 3.5–5.2)
Alkaline Phosphatase: 52 U/L (ref 39–117)
BUN: 17 mg/dL (ref 6–23)
CO2: 26 mEq/L (ref 19–32)
Calcium: 9.4 mg/dL (ref 8.4–10.5)
Chloride: 105 mEq/L (ref 96–112)
Creatinine, Ser: 1.02 mg/dL (ref 0.40–1.50)
GFR: 77.17 mL/min (ref >60.00)
Glucose, Bld: 95 mg/dL (ref 70–99)
Potassium: 4.2 mEq/L (ref 3.5–5.1)
Sodium: 138 mEq/L (ref 135–145)
Total Bilirubin: 1.8 mg/dL — ABNORMAL HIGH (ref 0.2–1.2)
Total Protein: 7.2 g/dL (ref 6.0–8.3)

## 2023-06-02 LAB — CBC WITH DIFFERENTIAL/PLATELET
Basophils Absolute: 0 10*3/uL (ref 0.0–0.1)
Basophils Relative: 0.7 % (ref 0.0–3.0)
Eosinophils Absolute: 0.1 10*3/uL (ref 0.0–0.7)
Eosinophils Relative: 2.4 % (ref 0.0–5.0)
HCT: 47.6 % (ref 39.0–52.0)
Hemoglobin: 15.6 g/dL (ref 13.0–17.0)
Lymphocytes Relative: 28 % (ref 12.0–46.0)
Lymphs Abs: 1.3 10*3/uL (ref 0.7–4.0)
MCHC: 32.8 g/dL (ref 30.0–36.0)
MCV: 91 fl (ref 78.0–100.0)
Monocytes Absolute: 0.3 10*3/uL (ref 0.1–1.0)
Monocytes Relative: 6.7 % (ref 3.0–12.0)
Neutro Abs: 2.9 10*3/uL (ref 1.4–7.7)
Neutrophils Relative %: 62.2 % (ref 43.0–77.0)
Platelets: 194 10*3/uL (ref 150.0–400.0)
RBC: 5.23 Mil/uL (ref 4.22–5.81)
RDW: 13.2 % (ref 11.5–15.5)
WBC: 4.7 10*3/uL (ref 4.0–10.5)

## 2023-06-02 LAB — HEMOGLOBIN A1C: Hgb A1c MFr Bld: 5.4 % (ref 4.6–6.5)

## 2023-06-02 NOTE — Assessment & Plan Note (Signed)
Here for CPX Hyperglycemia: Healthy lifestyle, check A1c Hypertension: On amlodipine, reports normal ambulatory BPs, labs. Dyslipidemia calculated CV RF was 14.7%, he started statins, good results, good compliance and tolerance.  Checking labs. BPH: Under the care of of urology.  Myrbetriq did not help, on tamsulosin with relatively good control. RTC 1 year

## 2023-06-02 NOTE — Patient Instructions (Addendum)
This is a list of vaccines I recommend, you can get them all at your local pharmacy RSV vaccine Pneumonia shot (PNM 20) Flu shot this fall COVID booster   Check the  blood pressure regularly BP GOAL is between 110/65 and  135/85. If it is consistently higher or lower, let me know      GO TO THE LAB : Get the blood work     GO TO THE FRONT DESK, PLEASE SCHEDULE YOUR APPOINTMENTS Come back for   a physical exam in 1 year    "Health Care Power of attorney" ,  "Living will" (Advance care planning documents)  If you already have a living will or healthcare power of attorney, is recommended you bring the copy to be scanned in your chart.   The document will be available to all the doctors you see in the system.  Advance care planning is a process that supports adults in  understanding and sharing their preferences regarding future medical care.  The patient's preferences are recorded in documents called Advance Directives and the can be modified at any time while the patient is in full mental capacity.   If you don't have one, please consider create one.      More information at: [email or web address]

## 2023-06-02 NOTE — Progress Notes (Signed)
Subjective:    Patient ID: Jacob Gould, male    DOB: 1955-12-16, 67 y.o.   MRN: [id number]  DOS:  06/02/2023 Type of visit - description: cpx  Here for CPX. Doing well. Saw urology, note reviewed, LUTS well-controlled,  only has mild nocturia.  Review of Systems  Other than above, a 14 point review of systems is negative    Past Medical History:  Diagnosis Date   Allergic rhinitis    Allergy    GERD (gastroesophageal reflux disease)    Hypertension    Low back pain    intermittent    Past Surgical History:  Procedure Laterality Date   COLONOSCOPY  09/07/2018   Dr.Stark   toe injury  2015   R great toe, saw ortho   Social History   Socioeconomic History   Marital status: Married    Spouse name: Not on file   Number of children: 3   Years of education: Not on file   Highest education level: Not on file  Occupational History   Occupation: retired Forensic scientist store    Comment: works part time  Tobacco Use   Smoking status: Former   Smokeless tobacco: Former   Tobacco comments:    quit in the 90s  Substance and Sexual Activity   Alcohol use: Yes    Alcohol/week: 7.0 standard drinks of alcohol    Types: 7 Glasses of wine per week    Comment: wine at night    Drug use: No   Sexual activity: Not on file  Other Topics Concern   Not on file  Social History Narrative   Lives w/ wife   3 adult children, 3 G-kids    Youngest son is in [employer] convey[employer]   Social Determinants of Corporate investment banker Strain: Not on file  Food Insecurity: Not on file  Transportation Needs: Not on file  Physical Activity: Not on file  Stress: Not on file  Social Connections: Not on file  Intimate Partner Violence: Not on file     Current Outpatient Medications  Medication Instructions   amLODipine (NORVASC) 10 MG tablet TAKE 1 TABLET BY MOUTH EVERY DAY   atorvastatin (LIPITOR) 10 mg, Oral, Daily at bedtime   fluticasone (FLONASE) 50 MCG/ACT nasal spray Each Nare, Daily    mirabegron ER (MYRBETRIQ) 25 mg, Oral, Daily   sildenafil (REVATIO) 60-80 mg, Oral, At bedtime PRN   tamsulosin (FLOMAX) 0.4 mg, Oral, Daily after supper       Objective:   Physical Exam BP 126/62   Pulse 87   Temp 98.1 F (36.7 C) (Oral)   Resp 16   Ht 5\' 11"  (1.803 m)   Wt 202 lb 6 oz (91.8 kg)   SpO2 98%   BMI 28.23 kg/m  General: Well developed, NAD, BMI noted Neck: No  thyromegaly  HEENT:  Normocephalic . Face symmetric, atraumatic Lungs:  CTA B Normal respiratory effort, no intercostal retractions, no accessory muscle use. Heart: RRR,  no murmur.  Abdomen:  Not distended, soft, non-tender. No rebound or rigidity.   Lower extremities: no pretibial edema bilaterally  Skin: Exposed areas without rash. Not pale. Not jaundice Neurologic:  alert & oriented X3.  Speech normal, gait appropriate for age and unassisted Strength symmetric and appropriate for age.  Psych: Cognition and judgment appear intact.  Cooperative with normal attention span and concentration.  Behavior appropriate. No anxious or depressed appearing.     Assessment     Assessment  Hyperglycemia  A1c 5.7 (2012) HTN Dyslipidemia GERD -- PPIs prn Occasional back pain Seasonal allergies BPH, asymmetric prostate, R side is slightly larger   PLAN Here for CPX --Td 2018 -Shingrex x2 (2020) - Last COVID-vaccine 08-2022, per literature okay to proceed with a booster. - Vaccines are recommended: PNM 20, RSV, flu shot this fall. --CCS: scope 9-09 neg except for hemorrhoids; cscope 10-219, C-scope 02/2022, next 2030 per GI letter -- Prostate cancer screening: Sees urology due to BPH and asymmetric prostate. --Diet-exercise: Going well, does not ride his bike anymore but goes to the gym regularly. --Labs:CMP FLP CBC a1C -- Healthcare POA: Information provided Hyperglycemia: Healthy lifestyle, check A1c Hypertension: On amlodipine, reports normal ambulatory BPs, labs. Dyslipidemia calculated CV  RF was 14.7%, he started statins, good results, good compliance and tolerance.  Checking labs. BPH: Under the care of of urology.  Myrbetriq did not help, on tamsulosin with relatively good control. RTC 1 year

## 2023-06-02 NOTE — Assessment & Plan Note (Signed)
--  Td 2018 -Shingrex x2 (2020) - Last COVID-vaccine 08-2022, per literature okay to proceed with a booster. - Vaccines are recommended: PNM 20, RSV, flu shot this fall. --CCS: scope 9-09 neg except for hemorrhoids; cscope 10-219, C-scope 02/2022, next 2030 per GI letter -- Prostate cancer screening: Sees urology due to BPH and asymmetric prostate. --Diet-exercise: Going well, does not ride his bike anymore but goes to the gym regularly. --Labs:CMP FLP CBC a1C -- Healthcare POA: Information provided.

## 2023-06-06 ENCOUNTER — Other Ambulatory Visit: Payer: Self-pay | Admitting: Internal Medicine

## 2023-06-06 MED ORDER — ATORVASTATIN CALCIUM 20 MG PO TABS: 20.0000 mg | ORAL_TABLET | Freq: Every day | ORAL | 3 refills | Status: DC

## 2023-06-16 ENCOUNTER — Encounter: Payer: Self-pay | Admitting: Urology

## 2023-06-16 ENCOUNTER — Ambulatory Visit: Payer: Medicare Other | Admitting: Urology

## 2023-06-16 VITALS — BP 149/85 | HR 82 | Ht 71.0 in | Wt 202.0 lb

## 2023-06-16 DIAGNOSIS — R339 Retention of urine, unspecified: Secondary | ICD-10-CM | POA: Diagnosis not present

## 2023-06-16 DIAGNOSIS — N401 Enlarged prostate with lower urinary tract symptoms: Secondary | ICD-10-CM

## 2023-06-16 DIAGNOSIS — N138 Other obstructive and reflux uropathy: Secondary | ICD-10-CM

## 2023-06-16 DIAGNOSIS — R351 Nocturia: Secondary | ICD-10-CM | POA: Diagnosis not present

## 2023-06-16 DIAGNOSIS — H5203 Hypermetropia, bilateral: Secondary | ICD-10-CM | POA: Diagnosis not present

## 2023-06-16 LAB — URINALYSIS, ROUTINE W REFLEX MICROSCOPIC
Bilirubin, UA: NEGATIVE
Glucose, UA: NEGATIVE
Ketones, UA: NEGATIVE
Leukocytes,UA: NEGATIVE
Nitrite, UA: NEGATIVE
Protein,UA: NEGATIVE
RBC, UA: NEGATIVE
Specific Gravity, UA: 1.01 (ref 1.005–1.030)
Urobilinogen, Ur: 0.2 mg/dL (ref 0.2–1.0)
pH, UA: 6.5 (ref 5.0–7.5)

## 2023-06-16 MED ORDER — TAMSULOSIN HCL 0.4 MG PO CAPS
0.4000 mg | ORAL_CAPSULE | Freq: Every day | ORAL | 3 refills | Status: DC
Start: 2023-06-16 — End: 2024-07-30

## 2023-06-16 NOTE — Progress Notes (Signed)
Assessment: 1. BPH with obstruction/lower urinary tract symptoms   2. Nocturia     Plan: Continue tamsulosin 0.4 mg daily PSA today Return to office in 6 months   Chief Complaint:  Chief Complaint  Patient presents with   Benign Prostatic Hypertrophy    History of Present Illness:  Jacob Gould is a 67 y.o. male who is seen for continued evaluation of abnormal DRE and lower urinary tract symptoms.  He noted worsening of his urinary symptoms in October 2023.  He was having increased nocturia 5-6 times per night and some dysuria.  He also noted some intermittency and decreased stream.  He was noted to have some asymmetry on prostate exam at that time.  Urinalysis was unremarkable.  Urine culture showed no growth. He was started on tamsulosin 0.4 mg daily with improvement in his urinary symptoms with decreased nocturia to 1-2 times per night.   IPSS = 14. PVR = 274 ml DRE showed asymmetry with R>L.  PSA results: 6/20 1.66 7/22 2.41 7/23 2.59 10/23 3.44 1/24 2.1  No history of elevated PSA.  No history of UTI or prostatitis.  No prior prostate biopsy.  He has a history of a kidney stone, passing the stone spontaneously approximately 3 years ago.  No recent stone symptoms.  He has a history of erectile dysfunction and uses sildenafil as needed with good results.  At his visit in 1/24, he continued on tamsulosin with improvement in his urinary symptoms with an improved urinary flow and decreased nocturia.  He continued with nocturia x 2.  No side effects from the medication.  No dysuria or gross hematuria. IPSS = 10. PVR = 146 ml.  At his visit in 4/24, he continued on tamsulosin 0.4 mg daily.  His urinary symptoms were fairly stable.  He continued to be bothered by nocturia 1-3 times per night.  No dysuria or gross hematuria. IPSS = 19.  PVR = 21 ml. He was given a trial of Myrbetriq 25 mg daily.  He returns today for follow-up.  He continues on tamsulosin 0.4 mg  daily.  His urinary symptoms are slighlty improved.  He continues with some decreased force of stream and intermittent stream.  He has nocturia 1-2 times per night.  He did not appreciate any significant change in his urinary symptoms with Myrbetriq.  No dysuria or gross hematuria. IPSS = 11 today.  Portions of the above documentation were copied from a prior visit for review purposes only.   Past Medical History:  Past Medical History:  Diagnosis Date   Allergic rhinitis    Allergy    GERD (gastroesophageal reflux disease)    Hypertension    Low back pain    intermittent    Past Surgical History:  Past Surgical History:  Procedure Laterality Date   COLONOSCOPY  09/07/2018   Dr.Stark   toe injury  2015   R great toe, saw ortho    Allergies:  No Known Allergies  Family History:  Family History  Problem Relation Age of Onset   Hypertension Mother        also GF   Pneumonia Father    Diabetes Neg Hx    Colon cancer Neg Hx    Prostate cancer Neg Hx    Heart attack Neg Hx    Colon polyps Neg Hx    Rectal cancer Neg Hx    Stomach cancer Neg Hx    Esophageal cancer Neg Hx  Social History:  Social History   Tobacco Use   Smoking status: Former   Smokeless tobacco: Former   Tobacco comments:    quit in the 90s  Substance Use Topics   Alcohol use: Yes    Alcohol/week: 7.0 standard drinks of alcohol    Types: 7 Glasses of wine per week    Comment: wine at night    Drug use: No    ROS: Constitutional:  Negative for fever, chills, weight loss CV: Negative for chest pain, previous MI, hypertension Respiratory:  Negative for shortness of breath, wheezing, sleep apnea, frequent cough GI:  Negative for nausea, vomiting, bloody stool, GERD  Physical exam: BP (!) 149/85   Pulse 82   Ht 5\' 11"  (1.803 m)   Wt 202 lb (91.6 kg)   BMI 28.17 kg/m  GENERAL APPEARANCE:  Well appearing, well developed, well nourished, NAD HEENT:  Atraumatic, normocephalic, oropharynx  clear NECK:  Supple without lymphadenopathy or thyromegaly ABDOMEN:  Soft, non-tender, no masses EXTREMITIES:  Moves all extremities well, without clubbing, cyanosis, or edema NEUROLOGIC:  Alert and oriented x 3, normal gait, CN II-XII grossly intact MENTAL STATUS:  appropriate BACK:  Non-tender to palpation, No CVAT SKIN:  Warm, dry, and intact GU: Prostate: 40 g, NT, slight asymmetry R>L Rectum: Normal tone,  no masses or tenderness    Results: U/A: negative

## 2023-06-17 LAB — PSA: Prostate Specific Ag, Serum: 2.3 ng/mL (ref 0.0–4.0)

## 2023-06-30 ENCOUNTER — Other Ambulatory Visit: Payer: Self-pay | Admitting: Internal Medicine

## 2023-12-17 ENCOUNTER — Ambulatory Visit: Payer: Medicare Other | Admitting: Urology

## 2023-12-17 ENCOUNTER — Encounter: Payer: Self-pay | Admitting: Urology

## 2023-12-17 VITALS — BP 149/95 | HR 84 | Ht 71.0 in | Wt 205.0 lb

## 2023-12-17 DIAGNOSIS — R351 Nocturia: Secondary | ICD-10-CM

## 2023-12-17 DIAGNOSIS — N138 Other obstructive and reflux uropathy: Secondary | ICD-10-CM

## 2023-12-17 DIAGNOSIS — N401 Enlarged prostate with lower urinary tract symptoms: Secondary | ICD-10-CM

## 2023-12-17 LAB — URINALYSIS, ROUTINE W REFLEX MICROSCOPIC
Bilirubin, UA: NEGATIVE
Glucose, UA: NEGATIVE
Ketones, UA: NEGATIVE
Leukocytes,UA: NEGATIVE
Nitrite, UA: NEGATIVE
Protein,UA: NEGATIVE
RBC, UA: NEGATIVE
Specific Gravity, UA: <1.005 — ABNORMAL LOW (ref 1.005–1.030)
Urobilinogen, Ur: 0.2 mg/dL (ref 0.2–1.0)
pH, UA: 6 (ref 5.0–7.5)

## 2023-12-17 NOTE — Progress Notes (Signed)
Assessment: 1. BPH with obstruction/lower urinary tract symptoms   2. Nocturia     Plan: Continue tamsulosin 0.4 mg daily PSA with annual exam in July. Return to office in 1 year  Chief Complaint:  Chief Complaint  Patient presents with   Benign Prostatic Hypertrophy    History of Present Illness:  Jacob Gould is a 68 y.o. male who is seen for continued evaluation of abnormal DRE and lower urinary tract symptoms.  He noted worsening of his urinary symptoms in October 2023.  He was having increased nocturia 5-6 times per night and some dysuria.  He also noted some intermittency and decreased stream.  He was noted to have some asymmetry on prostate exam at that time.  Urinalysis was unremarkable.  Urine culture showed no growth. He was started on tamsulosin 0.4 mg daily with improvement in his urinary symptoms with decreased nocturia to 1-2 times per night.   IPSS = 14. PVR = 274 ml DRE showed asymmetry with R>L.  PSA results: 6/20 1.66 7/22 2.41 7/23 2.59 10/23 3.44 1/24 2.1 7/24 2.3  No history of elevated PSA.  No history of UTI or prostatitis.  No prior prostate biopsy.  He has a history of a kidney stone, passing the stone spontaneously approximately 3 years ago.  No recent stone symptoms.  He has a history of erectile dysfunction and uses sildenafil as needed with good results.  At his visit in 1/24, he continued on tamsulosin with improvement in his urinary symptoms with an improved urinary flow and decreased nocturia.  He continued with nocturia x 2.  No side effects from the medication.  No dysuria or gross hematuria. IPSS = 10. PVR = 146 ml.  At his visit in 4/24, he continued on tamsulosin 0.4 mg daily.  His urinary symptoms were fairly stable.  He continued to be bothered by nocturia 1-3 times per night.  No dysuria or gross hematuria. IPSS = 19.  PVR = 21 ml. He was given a trial of Myrbetriq 25 mg daily.  At his visit in July 2024, he continued on  tamsulosin 0.4 mg daily.  His urinary symptoms were slighlty improved.  He continued with some decreased force of stream and intermittent stream.  He had nocturia 1-2 times per night.  He did not appreciate any significant change in his urinary symptoms with Myrbetriq.  No dysuria or gross hematuria. IPSS = 11.  He returns today for scheduled follow-up.  He continues on tamsulosin 0.4 mg daily.  His urinary symptoms are fairly stable.  He continues with some frequency, nocturia x 1-2, and occasional intermittent stream.  No dysuria or gross hematuria.  He is overall satisfied with his current voiding symptoms. IPSS = 10 QOL = 2/6 today.  Portions of the above documentation were copied from a prior visit for review purposes only.   Past Medical History:  Past Medical History:  Diagnosis Date   Allergic rhinitis    Allergy    GERD (gastroesophageal reflux disease)    Hypertension    Low back pain    intermittent    Past Surgical History:  Past Surgical History:  Procedure Laterality Date   COLONOSCOPY  09/07/2018   Dr.Stark   toe injury  2015   R great toe, saw ortho    Allergies:  No Known Allergies  Family History:  Family History  Problem Relation Age of Onset   Hypertension Mother        also GF  Pneumonia Father    Diabetes Neg Hx    Colon cancer Neg Hx    Prostate cancer Neg Hx    Heart attack Neg Hx    Colon polyps Neg Hx    Rectal cancer Neg Hx    Stomach cancer Neg Hx    Esophageal cancer Neg Hx     Social History:  Social History   Tobacco Use   Smoking status: Former   Smokeless tobacco: Former   Tobacco comments:    quit in the 90s  Substance Use Topics   Alcohol use: Yes    Alcohol/week: 7.0 standard drinks of alcohol    Types: 7 Glasses of wine per week    Comment: wine at night    Drug use: No    ROS: Constitutional:  Negative for fever, chills, weight loss CV: Negative for chest pain, previous MI, hypertension Respiratory:  Negative  for shortness of breath, wheezing, sleep apnea, frequent cough GI:  Negative for nausea, vomiting, bloody stool, GERD  Physical exam: BP (!) 149/95   Pulse 84   Ht 5\' 11"  (1.803 m)   Wt 205 lb (93 kg)   BMI 28.59 kg/m  GENERAL APPEARANCE:  Well appearing, well developed, well nourished, NAD HEENT:  Atraumatic, normocephalic, oropharynx clear NECK:  Supple without lymphadenopathy or thyromegaly ABDOMEN:  Soft, non-tender, no masses EXTREMITIES:  Moves all extremities well, without clubbing, cyanosis, or edema NEUROLOGIC:  Alert and oriented x 3, normal gait, CN II-XII grossly intact MENTAL STATUS:  appropriate BACK:  Non-tender to palpation, No CVAT SKIN:  Warm, dry, and intact   Results: U/A: negative

## 2023-12-31 ENCOUNTER — Ambulatory Visit: Payer: Medicare Other | Admitting: *Deleted

## 2023-12-31 DIAGNOSIS — Z0000 Encounter for general adult medical examination without abnormal findings: Secondary | ICD-10-CM

## 2023-12-31 NOTE — Progress Notes (Signed)
 Subjective:   Jacob Gould is a 68 y.o. male who presents for Medicare Annual/Subsequent preventive examination.  Visit Complete: Virtual I connected with  [name] Necessary on 12/31/23 by a audio enabled telemedicine application and verified that I am speaking with the correct person using two identifiers.  Patient Location: Home  Provider Location: Office/Clinic  I discussed the limitations of evaluation and management by telemedicine. The patient expressed understanding and agreed to proceed.  Vital Signs: Because this visit was a virtual/telehealth visit, some criteria may be missing or patient reported. Any vitals not documented were not able to be obtained and vitals that have been documented are patient reported.  Patient Medicare AWV questionnaire was completed by the patient on 12/24/23; I have confirmed that all information answered by patient is correct and no changes since this date.  Cardiac Risk Factors include: advanced age (>46men, >43 women);male gender;dyslipidemia;hypertension     Objective:    There were no vitals filed for this visit. There is no height or weight on file to calculate BMI.     12/31/2023    8:22 AM  Advanced Directives  Does Patient Have a Medical Advance Directive? Yes  Type of Advance Directive Living will  Does patient want to make changes to medical advance directive? No - Patient declined  Would patient like information on creating a medical advance directive? No - Patient declined    Current Medications (verified) Outpatient Encounter Medications as of 12/31/2023  Medication Sig   amLODipine  (NORVASC ) 10 MG tablet TAKE 1 TABLET BY MOUTH EVERY DAY   atorvastatin  (LIPITOR) 20 MG tablet Take 1 tablet (20 mg total) by mouth daily.   fluticasone  (FLONASE ) 50 MCG/ACT nasal spray Place into both nostrils daily.   sildenafil  (REVATIO ) 20 MG tablet Take 3-4 tablets (60-80 mg total) by mouth at bedtime as needed.   tamsulosin  (FLOMAX ) 0.4  MG CAPS capsule Take 1 capsule (0.4 mg total) by mouth daily after supper.   No facility-administered encounter medications on file as of 12/31/2023.    Allergies (verified) Patient has no known allergies.   History: Past Medical History:  Diagnosis Date   Allergic rhinitis    Allergy    GERD (gastroesophageal reflux disease)    Hypertension    Low back pain    intermittent   Past Surgical History:  Procedure Laterality Date   COLONOSCOPY  09/07/2018   Dr.Stark   toe injury  2015   R great toe, saw ortho   Family History  Problem Relation Age of Onset   Hypertension Mother        also GF   Pneumonia Father    Diabetes Neg Hx    Colon cancer Neg Hx    Prostate cancer Neg Hx    Heart attack Neg Hx    Colon polyps Neg Hx    Rectal cancer Neg Hx    Stomach cancer Neg Hx    Esophageal cancer Neg Hx    Social History   Socioeconomic History   Marital status: Married    Spouse name: Not on file   Number of children: 3   Years of education: Not on file   Highest education level: Not on file  Occupational History   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    Comment: works part time  Tobacco Use   Smoking status: Former   Smokeless tobacco: Former   Tobacco comments:    quit in the 90s  Substance and Sexual Activity  Alcohol use: Yes    Alcohol/week: 1.0 standard drink of alcohol    Types: 1 Glasses of wine per week    Comment: wine at night    Drug use: No   Sexual activity: Yes    Birth control/protection: Post-menopausal, None  Other Topics Concern   Not on file  Social History Narrative   Lives w/ wife   3 adult children, 3 G-kids    Youngest son is in Licensed Convey[employer]   Social Drivers of Health   Financial Resource Strain: Low Risk  (12/31/2023)   Overall Financial Resource Strain (CARDIA)    Difficulty of Paying Living Expenses: Not hard at all  Food Insecurity: No Food Insecurity (12/31/2023)   Hunger Vital Sign    Worried About Running Out of Food in the  Last Year: Never true    Ran Out of Food in the Last Year: Never true  Transportation Needs: No Transportation Needs (12/31/2023)   PRAPARE - Administrator, Civil Service (Medical): No    Lack of Transportation (Non-Medical): No  Physical Activity: Sufficiently Active (12/31/2023)   Exercise Vital Sign    Days of Exercise per Week: 3 days    Minutes of Exercise per Session: 90 min  Stress: No Stress Concern Present (12/31/2023)   [employer] of Occupational Health - Occupational Stress Questionnaire    Feeling of Stress : Not at all  Social Connections: Moderately Integrated (12/31/2023)   Social Connection and Isolation Panel [NHANES]    Frequency of Communication with Friends and Family: Once a week    Frequency of Social Gatherings with Friends and Family: More than three times a week    Attends Religious Services: Never    Database Administrator or Organizations: Yes    Attends Engineer, Structural: More than 4 times per year    Marital Status: Married    Tobacco Counseling Counseling given: Not Answered Tobacco comments: quit in the 90s   Clinical Intake:  Pre-visit preparation completed: Yes  Pain : No/denies pain  Nutritional Risks: None Diabetes: No  How often do you need to have someone help you when you read instructions, pamphlets, or other written materials from your doctor or pharmacy?: 1 - Never  Interpreter Needed?: No  Information entered by :: [employer], CMA   Activities of Daily Living    12/24/2023    9:05 AM  In your present state of health, do you have any difficulty performing the following activities:  Hearing? 0  Vision? 0  Difficulty concentrating or making decisions? 0  Walking or climbing stairs? 0  Dressing or bathing? 0  Doing errands, shopping? 0  Preparing Food and eating ? N  Using the Toilet? N  In the past six months, have you accidently leaked urine? N  Do you have problems with loss of bowel control? N   Managing your Medications? N  Managing your Finances? N  Housekeeping or managing your Housekeeping? N    Patient Care Team: Amon Aloysius BRAVO, MD as PCP - General Aneita Gwendlyn DASEN, MD (Inactive) as Consulting Physician (Gastroenterology)  Indicate any recent Medical Services you may have received from other than [facility] providers in the past year (date may be approximate).     Assessment:   This is a routine wellness examination for [facility].  Hearing/Vision screen No results found.   Goals Addressed   None    Depression Screen    12/31/2023    8:30 AM  06/02/2023    8:32 AM 09/12/2022    9:17 AM 05/27/2022    8:23 AM 05/25/2021    8:35 AM 05/19/2020    8:23 AM 05/19/2019   11:09 AM  PHQ 2/9 Scores  PHQ - 2 Score 0 0 0 0 0 0 0    Fall Risk    12/24/2023    9:05 AM 06/02/2023    8:32 AM 09/12/2022    9:18 AM 05/27/2022    8:23 AM 05/25/2021    8:35 AM  Fall Risk   Falls in the past year? 0 0 0 0 0  Number falls in past yr: 0 0 0 0 0  Injury with Fall? 0 0 0 0 0  Risk for fall due to : No Fall Risks      Follow up Falls evaluation completed Falls evaluation completed Falls evaluation completed Falls evaluation completed Falls evaluation completed    MEDICARE RISK AT HOME: Medicare Risk at Home Any stairs in or around the home?: (Patient-Rptd) Yes If so, are there any without handrails?: (Patient-Rptd) No Home free of loose throw rugs in walkways, pet beds, electrical cords, etc?: (Patient-Rptd) Yes Adequate lighting in your home to reduce risk of falls?: (Patient-Rptd) Yes Life alert?: (Patient-Rptd) No Use of a cane, walker or w/c?: (Patient-Rptd) No Grab bars in the bathroom?: (Patient-Rptd) No Shower chair or bench in shower?: (Patient-Rptd) Yes Elevated toilet seat or a handicapped toilet?: (Patient-Rptd) No  TIMED UP AND GO:  Was the test performed?  No    Cognitive Function:        12/31/2023    8:32 AM  6CIT Screen  What Year? 0 points  What month? 0 points  What  time? 0 points  Count back from 20 0 points  Months in reverse 0 points  Repeat phrase 0 points  Total Score 0 points    Immunizations Immunization History  Administered Date(s) Administered   Influenza Split 08/02/2014   Influenza, Seasonal, Injecte, Preservative Fre 08/24/2015   Influenza,inj,Quad PF,6+ Mos 08/25/2013, 07/20/2019, 08/10/2020, 08/24/2022   Influenza-Unspecified 08/27/2016, 08/21/2017, 08/25/2018   PFIZER Comirnaty(Gray Top)Covid-19 Tri-Sucrose Vaccine 03/10/2021, 09/02/2022   PFIZER(Purple Top)SARS-COV-2 Vaccination 02/05/2020, 02/28/2020, 08/31/2020   PNEUMOCOCCAL CONJUGATE-20 06/08/2023   Pfizer Covid-19 Vaccine Bivalent Booster 23yrs & up 08/17/2021   Td 10/28/2007   Tdap 04/07/2017   Zoster Recombinant(Shingrix ) 06/01/2019, 09/07/2019    TDAP status: Up to date  Flu Vaccine status: Declined, Education has been provided regarding the importance of this vaccine but patient still declined. Advised may receive this vaccine at local pharmacy or Health Dept. Aware to provide a copy of the vaccination record if obtained from local pharmacy or Health Dept. Verbalized acceptance and understanding.  Pneumococcal vaccine status: Up to date  Covid-19 vaccine status: Information provided on how to obtain vaccines.   Qualifies for Shingles Vaccine? Yes   Zostavax completed No   Shingrix  Completed?: Yes  Screening Tests Health Maintenance  Topic Date Due   Medicare Annual Wellness (AWV)  Never done   COVID-19 Vaccine (7 - 2024-25 season) 07/27/2023   INFLUENZA VACCINE  02/23/2024 (Originally 06/26/2023)   DTaP/Tdap/Td (3 - Td or Tdap) 04/08/2027   Colonoscopy  03/12/2029   Pneumonia Vaccine 74+ Years old  Completed   Hepatitis C Screening  Completed   Zoster Vaccines- Shingrix   Completed   HPV VACCINES  Aged Out    Health Maintenance  Health Maintenance Due  Topic Date Due   Medicare Annual Wellness (AWV)  Never  done   COVID-19 Vaccine (7 - 2024-25 season)  07/27/2023    Colorectal cancer screening: Type of screening: Colonoscopy. Completed 03/12/22. Repeat every 7 years  Lung Cancer Screening: (Low Dose CT Chest recommended if Age 62-80 years, 20 pack-year currently smoking OR have quit w/in 15years.) does not qualify.   Additional Screening:  Hepatitis C Screening: does qualify; Completed 04/07/17  Vision Screening: Recommended annual ophthalmology exams for early detection of glaucoma and other disorders of the eye. Is the patient up to date with their annual eye exam?  Yes  Who is the provider or what is the name of the office in which the patient attends annual eye exams? [facility] If pt is not established with a provider, would they like to be referred to a provider to establish care? No .   Dental Screening: Recommended annual dental exams for proper oral hygiene  Diabetic Foot Exam: N/a  Community Resource Referral / Chronic Care Management: CRR required this visit?  No   CCM required this visit?  No     Plan:     I have personally reviewed and noted the following in the patient's chart:   Medical and social history Use of alcohol, tobacco or illicit drugs  Current medications and supplements including opioid prescriptions. Patient is not currently taking opioid prescriptions. Functional ability and status Nutritional status Physical activity Advanced directives List of other physicians Hospitalizations, surgeries, and ER visits in previous 12 months Vitals Screenings to include cognitive, depression, and falls Referrals and appointments  In addition, I have reviewed and discussed with patient certain preventive protocols, quality metrics, and best practice recommendations. A written personalized care plan for preventive services as well as general preventive health recommendations were provided to patient.     Kandis Gauze, CMA   12/31/2023   After Visit Summary: (MyChart) Due to this being a telephonic visit,  the after visit summary with patients personalized plan was offered to patient via MyChart   Nurse Notes: None

## 2023-12-31 NOTE — Patient Instructions (Signed)
 Jacob Gould , Thank you for taking time to come for your Medicare Wellness Visit. I appreciate your ongoing commitment to your health goals. Please review the following plan we discussed and let me know if I can assist you in the future.     This is a list of the screening recommended for you and due dates:  Health Maintenance  Topic Date Due   COVID-19 Vaccine (7 - 2024-25 season) 07/27/2023   Flu Shot  02/23/2024*   Medicare Annual Wellness Visit  12/30/2024   DTaP/Tdap/Td vaccine (3 - Td or Tdap) 04/08/2027   Colon Cancer Screening  03/12/2029   Pneumonia Vaccine  Completed   Hepatitis C Screening  Completed   Zoster (Shingles) Vaccine  Completed   HPV Vaccine  Aged Out  *Topic was postponed. The date shown is not the original due date.    Next appointment: Follow up in one year for your annual wellness visit.   Preventive Care 66 Years and Older, Male Preventive care refers to lifestyle choices and visits with your health care provider that can promote health and wellness. What does preventive care include? A yearly physical exam. This is also called an annual well check. Dental exams once or twice a year. Routine eye exams. Ask your health care provider how often you should have your eyes checked. Personal lifestyle choices, including: Daily care of your teeth and gums. Regular physical activity. Eating a healthy diet. Avoiding tobacco and drug use. Limiting alcohol use. Practicing safe sex. Taking low doses of aspirin every day. Taking vitamin and mineral supplements as recommended by your health care provider. What happens during an annual well check? The services and screenings done by your health care provider during your annual well check will depend on your age, overall health, lifestyle risk factors, and family history of disease. Counseling  Your health care provider may ask you questions about your: Alcohol use. Tobacco use. Drug use. Emotional  well-being. Home and relationship well-being. Sexual activity. Eating habits. History of falls. Memory and ability to understand (cognition). Work and work astronomer. Screening  You may have the following tests or measurements: Height, weight, and BMI. Blood pressure. Lipid and cholesterol levels. These may be checked every 5 years, or more frequently if you are over 21 years old. Skin check. Lung cancer screening. You may have this screening every year starting at age 66 if you have a 30-pack-year history of smoking and currently smoke or have quit within the past 15 years. Fecal occult blood test (FOBT) of the stool. You may have this test every year starting at age 32. Flexible sigmoidoscopy or colonoscopy. You may have a sigmoidoscopy every 5 years or a colonoscopy every 10 years starting at age 63. Prostate cancer screening. Recommendations will vary depending on your family history and other risks. Hepatitis C blood test. Hepatitis B blood test. Sexually transmitted disease (STD) testing. Diabetes screening. This is done by checking your blood sugar (glucose) after you have not eaten for a while (fasting). You may have this done every 1-3 years. Abdominal aortic aneurysm (AAA) screening. You may need this if you are a current or former smoker. Osteoporosis. You may be screened starting at age 49 if you are at high risk. Talk with your health care provider about your test results, treatment options, and if necessary, the need for more tests. Vaccines  Your health care provider may recommend certain vaccines, such as: Influenza vaccine. This is recommended every year. Tetanus, diphtheria, and acellular  pertussis (Tdap, Td) vaccine. You may need a Td booster every 10 years. Zoster vaccine. You may need this after age 66. Pneumococcal 13-valent conjugate (PCV13) vaccine. One dose is recommended after age 66. Pneumococcal polysaccharide (PPSV23) vaccine. One dose is recommended after  age 66. Talk to your health care provider about which screenings and vaccines you need and how often you need them. This information is not intended to replace advice given to you by your health care provider. Make sure you discuss any questions you have with your health care provider. Document Released: 12/08/2015 Document Revised: 07/31/2016 Document Reviewed: 09/12/2015 Elsevier Interactive Patient Education  2017 [employer].  Fall Prevention in the Home Falls can cause injuries. They can happen to people of all ages. There are many things you can do to make your home safe and to help prevent falls. What can I do on the outside of my home? Regularly fix the edges of walkways and driveways and fix any cracks. Remove anything that might make you trip as you walk through a door, such as a raised step or threshold. Trim any bushes or trees on the path to your home. Use bright outdoor lighting. Clear any walking paths of anything that might make someone trip, such as rocks or tools. Regularly check to see if handrails are loose or broken. Make sure that both sides of any steps have handrails. Any raised decks and porches should have guardrails on the edges. Have any leaves, snow, or ice cleared regularly. Use sand or salt on walking paths during winter. Clean up any spills in your garage right away. This includes oil or grease spills. What can I do in the bathroom? Use night lights. Install grab bars by the toilet and in the tub and shower. Do not use towel bars as grab bars. Use non-skid mats or decals in the tub or shower. If you need to sit down in the shower, use a plastic, non-slip stool. Keep the floor dry. Clean up any water that spills on the floor as soon as it happens. Remove soap buildup in the tub or shower regularly. Attach bath mats securely with double-sided non-slip rug tape. Do not have throw rugs and other things on the floor that can make you trip. What can I do in the  bedroom? Use night lights. Make sure that you have a light by your bed that is easy to reach. Do not use any sheets or blankets that are too big for your bed. They should not hang down onto the floor. Have a firm chair that has side arms. You can use this for support while you get dressed. Do not have throw rugs and other things on the floor that can make you trip. What can I do in the kitchen? Clean up any spills right away. Avoid walking on wet floors. Keep items that you use a lot in easy-to-reach places. If you need to reach something above you, use a strong step stool that has a grab bar. Keep electrical cords out of the way. Do not use floor polish or wax that makes floors slippery. If you must use wax, use non-skid floor wax. Do not have throw rugs and other things on the floor that can make you trip. What can I do with my stairs? Do not leave any items on the stairs. Make sure that there are handrails on both sides of the stairs and use them. Fix handrails that are broken or loose. Make sure that handrails  are as long as the stairways. Check any carpeting to make sure that it is firmly attached to the stairs. Fix any carpet that is loose or worn. Avoid having throw rugs at the top or bottom of the stairs. If you do have throw rugs, attach them to the floor with carpet tape. Make sure that you have a light switch at the top of the stairs and the bottom of the stairs. If you do not have them, ask someone to add them for you. What else can I do to help prevent falls? Wear shoes that: Do not have high heels. Have rubber bottoms. Are comfortable and fit you well. Are closed at the toe. Do not wear sandals. If you use a stepladder: Make sure that it is fully opened. Do not climb a closed stepladder. Make sure that both sides of the stepladder are locked into place. Ask someone to hold it for you, if possible. Clearly mark and make sure that you can see: Any grab bars or  handrails. First and last steps. Where the edge of each step is. Use tools that help you move around (mobility aids) if they are needed. These include: Canes. Walkers. Scooters. Crutches. Turn on the lights when you go into a dark area. Replace any light bulbs as soon as they burn out. Set up your furniture so you have a clear path. Avoid moving your furniture around. If any of your floors are uneven, fix them. If there are any pets around you, be aware of where they are. Review your medicines with your doctor. Some medicines can make you feel dizzy. This can increase your chance of falling. Ask your doctor what other things that you can do to help prevent falls. This information is not intended to replace advice given to you by your health care provider. Make sure you discuss any questions you have with your health care provider. Document Released: 09/07/2009 Document Revised: 04/18/2016 Document Reviewed: 12/16/2014 Elsevier Interactive Patient Education  2017 [employer].

## 2024-03-29 DIAGNOSIS — K080 Exfoliation of teeth due to systemic causes: Secondary | ICD-10-CM | POA: Diagnosis not present

## 2024-04-13 DIAGNOSIS — K080 Exfoliation of teeth due to systemic causes: Secondary | ICD-10-CM | POA: Diagnosis not present

## 2024-05-22 ENCOUNTER — Other Ambulatory Visit: Payer: Self-pay | Admitting: Internal Medicine

## 2024-06-02 ENCOUNTER — Ambulatory Visit (INDEPENDENT_AMBULATORY_CARE_PROVIDER_SITE_OTHER): Payer: Medicare Other | Admitting: Internal Medicine

## 2024-06-02 ENCOUNTER — Encounter: Payer: Self-pay | Admitting: Internal Medicine

## 2024-06-02 VITALS — BP 142/80 | HR 83 | Temp 97.6°F | Resp 16 | Ht 71.0 in | Wt 203.1 lb

## 2024-06-02 DIAGNOSIS — E041 Nontoxic single thyroid nodule: Secondary | ICD-10-CM

## 2024-06-02 DIAGNOSIS — N401 Enlarged prostate with lower urinary tract symptoms: Secondary | ICD-10-CM

## 2024-06-02 DIAGNOSIS — N138 Other obstructive and reflux uropathy: Secondary | ICD-10-CM | POA: Diagnosis not present

## 2024-06-02 DIAGNOSIS — R739 Hyperglycemia, unspecified: Secondary | ICD-10-CM | POA: Diagnosis not present

## 2024-06-02 DIAGNOSIS — E7800 Pure hypercholesterolemia, unspecified: Secondary | ICD-10-CM

## 2024-06-02 DIAGNOSIS — I10 Essential (primary) hypertension: Secondary | ICD-10-CM

## 2024-06-02 DIAGNOSIS — Z0000 Encounter for general adult medical examination without abnormal findings: Secondary | ICD-10-CM

## 2024-06-02 DIAGNOSIS — Z0001 Encounter for general adult medical examination with abnormal findings: Secondary | ICD-10-CM

## 2024-06-02 LAB — CBC WITH DIFFERENTIAL/PLATELET
Basophils Absolute: 0 K/uL (ref 0.0–0.1)
Basophils Relative: 0.3 % (ref 0.0–3.0)
Eosinophils Absolute: 0.2 K/uL (ref 0.0–0.7)
Eosinophils Relative: 3.1 % (ref 0.0–5.0)
HCT: 46.6 % (ref 39.0–52.0)
Hemoglobin: 15.8 g/dL (ref 13.0–17.0)
Lymphocytes Relative: 26.2 % (ref 12.0–46.0)
Lymphs Abs: 1.4 K/uL (ref 0.7–4.0)
MCHC: 33.9 g/dL (ref 30.0–36.0)
MCV: 89.9 fl (ref 78.0–100.0)
Monocytes Absolute: 0.5 K/uL (ref 0.1–1.0)
Monocytes Relative: 9.7 % (ref 3.0–12.0)
Neutro Abs: 3.1 K/uL (ref 1.4–7.7)
Neutrophils Relative %: 60.7 % (ref 43.0–77.0)
Platelets: 181 K/uL (ref 150.0–400.0)
RBC: 5.18 Mil/uL (ref 4.22–5.81)
RDW: 13.1 % (ref 11.5–15.5)
WBC: 5.2 K/uL (ref 4.0–10.5)

## 2024-06-02 LAB — COMPREHENSIVE METABOLIC PANEL WITH GFR
ALT: 15 U/L (ref 0–53)
AST: 14 U/L (ref 0–37)
Albumin: 4.5 g/dL (ref 3.5–5.2)
Alkaline Phosphatase: 62 U/L (ref 39–117)
BUN: 15 mg/dL (ref 6–23)
CO2: 30 meq/L (ref 19–32)
Calcium: 9.2 mg/dL (ref 8.4–10.5)
Chloride: 105 meq/L (ref 96–112)
Creatinine, Ser: 1 mg/dL (ref 0.40–1.50)
GFR: 78.47 mL/min (ref >60.00)
Glucose, Bld: 99 mg/dL (ref 70–99)
Potassium: 4.2 meq/L (ref 3.5–5.1)
Sodium: 140 meq/L (ref 135–145)
Total Bilirubin: 1.9 mg/dL — ABNORMAL HIGH (ref 0.2–1.2)
Total Protein: 7.2 g/dL (ref 6.0–8.3)

## 2024-06-02 LAB — LIPID PANEL
Cholesterol: 118 mg/dL (ref 0–200)
HDL: 56.7 mg/dL (ref >39.00)
LDL Cholesterol: 49 mg/dL (ref 0–99)
NonHDL: 61.21
Total CHOL/HDL Ratio: 2
Triglycerides: 63 mg/dL (ref 0.0–149.0)
VLDL: 12.6 mg/dL (ref 0.0–40.0)

## 2024-06-02 LAB — PSA: PSA: 2.37 ng/mL (ref 0.10–4.00)

## 2024-06-02 NOTE — Progress Notes (Unsigned)
   Subjective:    Patient ID: Jacob Gould, male    DOB: Dec 26, 1955, 68 y.o.   MRN: [id number]  DOS:  06/02/2024 Type of visit - description: CPX  Feeling well, no major concerns. LUTS symptoms controlled with Flomax   Review of Systems See above   Past Medical History:  Diagnosis Date   Allergic rhinitis    Allergy    GERD (gastroesophageal reflux disease)    Hypertension    Low back pain    intermittent    Past Surgical History:  Procedure Laterality Date   COLONOSCOPY  09/07/2018   Dr.Stark   toe injury  2015   R great toe, saw ortho    Current Outpatient Medications  Medication Instructions   amLODipine  (NORVASC ) 10 MG tablet TAKE 1 TABLET BY MOUTH EVERY DAY   atorvastatin  (LIPITOR) 20 mg, Oral, Daily   fluticasone  (FLONASE ) 50 MCG/ACT nasal spray Daily   sildenafil  (REVATIO ) 60-80 mg, Oral, At bedtime PRN   tamsulosin  (FLOMAX ) 0.4 mg, Oral, Daily after supper       Objective:   Physical Exam BP (!) 146/84   Pulse 83   Temp 97.6 F (36.4 C) (Oral)   Resp 16   Ht 5' 11 (1.803 m)   Wt 203 lb 2 oz (92.1 kg)   SpO2 99%   BMI 28.33 kg/m  General: Well developed, NAD, BMI noted Neck: Question of right thyroid  nodule. HEENT:  Normocephalic . Face symmetric, atraumatic Lungs:  CTA B Normal respiratory effort, no intercostal retractions, no accessory muscle use. Heart: RRR,  no murmur.  Abdomen:  Not distended, soft, non-tender. No rebound or rigidity.   Lower extremities: no pretibial edema bilaterally  Skin: Exposed areas without rash. Not pale. Not jaundice Neurologic:  alert & oriented X3.  Speech normal, gait appropriate for age and unassisted Strength symmetric and appropriate for age.  Psych: Cognition and judgment appear intact.  Cooperative with normal attention span and concentration.  Behavior appropriate. No anxious or depressed appearing.     Assessment     Assessment Hyperglycemia  A1c 5.7 (2012) HTN Dyslipidemia GERD --  PPIs prn Occasional back pain Seasonal allergies BPH, asymmetric prostate, R side is slightly larger   PLAN Here for CPX --Td 2018 -Shingrex x2 (2020); PNM 20: 2024 - Vaccines are recommended:  flu shot this fall. Plans to get a covid booster  --CCS: scope 9-09 neg except for hemorrhoids; cscope 10-219, C-scope 02/2022, next 2030 per GI letter  -- Prostate cancer screening: See comments under BPH --Diet-exercise: Exercises regularly at the [facility].  Doing well. --Labs:CMP FLP CBC PSA  All issues addressed today Hyperglycemia: Last A1c very good HTN: BP at urology in January 149/95; upon arrival 146/84.  Recheck: 142/80 No recent ambulatory BPs, good compliance with amlodipine  10 mg. Plan: Check BPs twice a week, call with readings in 2 months.  Goals provided.  Labs. BPH: LOV urology 12/17/2023.  Next follow-up in 1 year, do recommend to check a PSA in July.  Will check and share the results Dyslipidemia: On atorvastatin , labs. RTC 1 year CPX

## 2024-06-02 NOTE — Patient Instructions (Addendum)
  Start checking your blood pressure twice a week Drink plenty of fluids, avoid excessive salt Blood pressure goal:  between 110/65 and  135/85. Call call or drop at list of your blood pressure readings in 2 months.  GO TO THE LAB :  Get the blood work   Your results will be posted on MyChart with my comments  Next office visit for a physical exam in 1 year (sooner if your blood pressure is not well-controlled).    Please make an appointment before you leave today

## 2024-06-03 ENCOUNTER — Encounter: Payer: Self-pay | Admitting: Internal Medicine

## 2024-06-03 ENCOUNTER — Ambulatory Visit: Payer: Self-pay | Admitting: Internal Medicine

## 2024-06-03 ENCOUNTER — Ambulatory Visit
Admission: RE | Admit: 2024-06-03 | Discharge: 2024-06-03 | Disposition: A | Discharge status: Home / Self Care | Source: Ambulatory Visit | Attending: Internal Medicine | Admitting: Internal Medicine

## 2024-06-03 DIAGNOSIS — E7800 Pure hypercholesterolemia, unspecified: Secondary | ICD-10-CM | POA: Insufficient documentation

## 2024-06-03 DIAGNOSIS — E041 Nontoxic single thyroid nodule: Secondary | ICD-10-CM | POA: Diagnosis not present

## 2024-06-03 NOTE — Assessment & Plan Note (Addendum)
 Here for CPX  Other  issues addressed today Hyperglycemia: Last A1c very good HTN: BP at urology in January 149/95; upon arrival 146/84.  Recheck: 142/80 No recent ambulatory BPs, good compliance with amlodipine  10 mg. Plan: Check BPs twice a week, call with readings in 2 months.  Goals provided.  Labs. BPH: LOV urology 12/17/2023.  Next follow-up in 1 year, they rec to check a PSA in July.  Will check and share the results Dyslipidemia: On atorvastatin , labs. Thyroid  nodule? See physical exam, question of R sided thyroid  nodule. Check US  RTC 1 year CPX

## 2024-06-03 NOTE — Assessment & Plan Note (Signed)
 Here for CPX --Td 2018 -Shingrex x2 (2020); PNM 20: 2024 - Vaccines are recommended:  flu shot this fall. Plans to get a covid booster  --CCS: scope 9-09 neg except for hemorrhoids; cscope 10-219, C-scope 02/2022, next 2030 per GI letter -- Prostate cancer screening: See comments under BPH --Diet-exercise: Exercises regularly at the [facility].  Doing well. --Labs:CMP FLP CBC PSA

## 2024-06-04 ENCOUNTER — Other Ambulatory Visit: Payer: Self-pay

## 2024-06-04 DIAGNOSIS — E041 Nontoxic single thyroid nodule: Secondary | ICD-10-CM

## 2024-06-08 ENCOUNTER — Other Ambulatory Visit: Payer: Self-pay | Admitting: Internal Medicine

## 2024-06-08 DIAGNOSIS — E041 Nontoxic single thyroid nodule: Secondary | ICD-10-CM

## 2024-06-16 ENCOUNTER — Ambulatory Visit
Admission: RE | Admit: 2024-06-16 | Discharge: 2024-06-16 | Disposition: A | Discharge status: Home / Self Care | Source: Ambulatory Visit | Attending: Internal Medicine | Admitting: Internal Medicine

## 2024-06-16 ENCOUNTER — Other Ambulatory Visit (HOSPITAL_COMMUNITY)
Admission: RE | Admit: 2024-06-16 | Discharge: 2024-06-16 | Disposition: A | Discharge status: Home / Self Care | Source: Ambulatory Visit | Attending: Internal Medicine | Admitting: Internal Medicine

## 2024-06-16 DIAGNOSIS — E041 Nontoxic single thyroid nodule: Secondary | ICD-10-CM

## 2024-06-16 DIAGNOSIS — E079 Disorder of thyroid, unspecified: Secondary | ICD-10-CM | POA: Diagnosis not present

## 2024-06-18 ENCOUNTER — Other Ambulatory Visit: Payer: Self-pay | Admitting: Internal Medicine

## 2024-06-18 LAB — CYTOLOGY - NON PAP

## 2024-06-30 ENCOUNTER — Telehealth: Payer: Self-pay | Admitting: Internal Medicine

## 2024-06-30 DIAGNOSIS — E041 Nontoxic single thyroid nodule: Secondary | ICD-10-CM | POA: Diagnosis not present

## 2024-06-30 NOTE — Telephone Encounter (Signed)
 Thyroid  biopsy 06/16/2024: RT-PCR, Bethesda 3. Afirma test pending. Please call the lab, is Afirma test  ready?SABRA

## 2024-06-30 NOTE — Telephone Encounter (Addendum)
 Spoke w/ [facility] path office [phone])- Ronal Hun checked afirma portal, results still in process/pending. Has an estimated date of 07/03/24. She will have path report auto-faxed to our office once available.

## 2024-07-05 ENCOUNTER — Encounter (HOSPITAL_COMMUNITY): Payer: Self-pay

## 2024-07-05 DIAGNOSIS — K080 Exfoliation of teeth due to systemic causes: Secondary | ICD-10-CM | POA: Diagnosis not present

## 2024-07-05 NOTE — Telephone Encounter (Signed)
 Afirma test is now available: Benign. Please call patient, Afirma tests look good but still need the opinion of endocrinology. Arrange a referral to endocrinology.  Dx thyroid  nodule.

## 2024-07-05 NOTE — Telephone Encounter (Signed)
 Patient notified and referral placed

## 2024-07-20 ENCOUNTER — Other Ambulatory Visit: Payer: Self-pay | Admitting: Urology

## 2024-07-20 DIAGNOSIS — N138 Other obstructive and reflux uropathy: Secondary | ICD-10-CM

## 2024-07-30 ENCOUNTER — Other Ambulatory Visit: Payer: Self-pay

## 2024-07-30 DIAGNOSIS — N138 Other obstructive and reflux uropathy: Secondary | ICD-10-CM

## 2024-07-30 MED ORDER — TAMSULOSIN HCL 0.4 MG PO CAPS: 0.4000 mg | ORAL_CAPSULE | Freq: Every day | ORAL | 3 refills | Status: AC

## 2024-08-31 DIAGNOSIS — K080 Exfoliation of teeth due to systemic causes: Secondary | ICD-10-CM | POA: Diagnosis not present

## 2024-10-12 ENCOUNTER — Other Ambulatory Visit

## 2024-10-12 ENCOUNTER — Ambulatory Visit: Admitting: Endocrinology

## 2024-10-12 ENCOUNTER — Encounter: Payer: Self-pay | Admitting: Endocrinology

## 2024-10-12 VITALS — BP 142/82 | HR 93 | Ht 71.0 in | Wt 210.0 lb

## 2024-10-12 DIAGNOSIS — E042 Nontoxic multinodular goiter: Secondary | ICD-10-CM | POA: Diagnosis not present

## 2024-10-12 NOTE — Progress Notes (Signed)
 Outpatient Endocrinology Note [name], MD   Patient's Name: Jacob Gould    DOB: 1956/05/08    MRN: [id number]  REASON OF VISIT: New consult for thyroid  nodules   PCP: Amon Aloysius FORBES, MD  REFERRING PROVIDER: Amon Aloysius FORBES, MD  HISTORY OF PRESENT ILLNESS:   Jacob Gould is a 68 y.o. old male with past medical history as listed below is presented for new consult for thyroid  nodules.  Pertinent Thyroid  History:  Patient is referred to endocrinology for evaluation and management of thyroid  nodules, initial consult on October 12, 2024.  Patient was found to have thyroid  nodule on physical exam by primary care provider, subsequently had ultrasound thyroid  on June 03, 2024 showed right inferior solid hypoechoic thyroid  nodule measuring 2 x 1.7 x 1.0 cm and right mid predominantly cystic nodule measuring 2 cm.  Small left thyroid  nodule.  In June 16, 2024 patient underwent FNA of right inferior thyroid  nodule measuring 2 cm hypoechoic solid, cytology AUS and Afirma genomic sequencing classifier was benign.  FINAL MICROSCOPIC DIAGNOSIS: FNA on June 16, 2024 of right inferior thyroid  nodule. - Atypia of undetermined significance (Bethesda category III)   Patient denies neck compressive symptoms, neck discomfort or dysphagia or shortness of breath in any position.  He is euthyroid not on thyroid  medication.  Denies hypo or hyperthyroid symptoms.  Patient denies family history of thyroid  disorder or thyroid  cancer.  No radiation exposure to head and neck.  - Thyroid  US  on June 03, 2024: reviewed images.  CLINICAL DATA:  Goiter.  Right-sided thyroid  nodule   EXAM: THYROID  ULTRASOUND   TECHNIQUE: Ultrasound examination of the thyroid  gland and adjacent soft tissues was performed.   COMPARISON:  None Available.   FINDINGS: Parenchymal Echotexture: Mildly heterogenous   Isthmus: 0.3 cm   Right lobe: 5.2 x 2.1 x 2.2 cm   Left lobe: 4.6 x 1.5 x 1.7 cm    _________________________________________________________   Estimated total number of nodules >/= 1 cm: 3   Number of spongiform nodules >/=  2 cm not described below (TR1): 0   Number of mixed cystic and solid nodules >/= 1.5 cm not described below (TR2): 0   _________________________________________________________   Nodule # 1: Predominantly cystic nodule with some mild peripheral colloid artifact in the right mid gland measures 2.0 x 2.0 x 1.5 cm. Findings are consistent with TI-RADS category 2. This nodule does NOT meet TI-RADS criteria for biopsy or dedicated follow-up.   Nodule # 2: Hypoechoic solid nodule in the right lower gland measures 2.0 x 1.7 x 1.0 cm. Findings are circumscribed. No calcifications or other suspicious features. Findings are consistent with TI-RADS category 4. **Given size (>/= 1.5 cm) and appearance, fine needle aspiration of this moderately suspicious nodule should be considered based on TI-RADS criteria.   Small thyroid  nodules in the left mid gland are noted but considered incidental. No further follow-up recommended.   IMPRESSION: 1. Nodule # 2 is a 2 cm TI-RADS category 4 nodule in the right lower gland and these criteria to consider fine-needle aspiration biopsy. Biopsy is recommended.   The above is in keeping with the ACR TI-RADS recommendations - J Am Coll Radiol 2017;14:587-595.  - Labs:   Latest Reference Range & Units 05/25/21 08:55  TSH 0.35 - 5.50 uIU/mL 2.16    REVIEW OF SYSTEMS:  As per history of present illness.   PAST MEDICAL HISTORY: Past Medical History:  Diagnosis Date   Allergic rhinitis  Allergy    GERD (gastroesophageal reflux disease)    Hypertension    Low back pain    intermittent    PAST SURGICAL HISTORY: Past Surgical History:  Procedure Laterality Date   COLONOSCOPY  09/07/2018   Dr.Stark   toe injury  2015   R great toe, saw ortho    ALLERGIES: No Known Allergies  FAMILY HISTORY:   Family History  Problem Relation Age of Onset   Hypertension Mother        also GF   Pneumonia Father    Diabetes Neg Hx    Colon cancer Neg Hx    Prostate cancer Neg Hx    Heart attack Neg Hx    Colon polyps Neg Hx    Rectal cancer Neg Hx    Stomach cancer Neg Hx    Esophageal cancer Neg Hx     SOCIAL HISTORY: Social History   Socioeconomic History   Marital status: Married    Spouse name: Not on file   Number of children: 3   Years of education: Not on file   Highest education level: Not on file  Occupational History   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    Comment: works part time  Tobacco Use   Smoking status: Former   Smokeless tobacco: Former   Tobacco comments:    quit in the 90s  Substance and Sexual Activity   Alcohol use: Yes    Alcohol/week: 1.0 standard drink of alcohol    Types: 1 Glasses of wine per week    Comment: wine at night    Drug use: No   Sexual activity: Yes    Birth control/protection: Post-menopausal, None  Other Topics Concern   Not on file  Social History Narrative   Lives w/ wife   3 adult children, 3 G-kids    Youngest son is in Licensed Convey[employer]   Social Drivers of Health   Financial Resource Strain: Low Risk  (12/31/2023)   Overall Financial Resource Strain (CARDIA)    Difficulty of Paying Living Expenses: Not hard at all  Food Insecurity: No Food Insecurity (12/31/2023)   Hunger Vital Sign    Worried About Running Out of Food in the Last Year: Never true    Ran Out of Food in the Last Year: Never true  Transportation Needs: No Transportation Needs (12/31/2023)   PRAPARE - Administrator, Civil Service (Medical): No    Lack of Transportation (Non-Medical): No  Physical Activity: Sufficiently Active (12/31/2023)   Exercise Vital Sign    Days of Exercise per Week: 3 days    Minutes of Exercise per Session: 90 min  Stress: No Stress Concern Present (12/31/2023)   [employer] of Occupational Health - Occupational Stress  Questionnaire    Feeling of Stress : Not at all  Social Connections: Moderately Integrated (12/31/2023)   Social Connection and Isolation Panel    Frequency of Communication with Friends and Family: Once a week    Frequency of Social Gatherings with Friends and Family: More than three times a week    Attends Religious Services: Never    Database Administrator or Organizations: Yes    Attends Engineer, Structural: More than 4 times per year    Marital Status: Married    MEDICATIONS:  Current Outpatient Medications  Medication Sig Dispense Refill   amLODipine  (NORVASC ) 10 MG tablet TAKE 1 TABLET BY MOUTH EVERY DAY 90 tablet 3   atorvastatin  (LIPITOR) 20  MG tablet Take 1 tablet (20 mg total) by mouth daily. 90 tablet 1   fluticasone  (FLONASE ) 50 MCG/ACT nasal spray Place into both nostrils daily.     sildenafil  (REVATIO ) 20 MG tablet Take 3-4 tablets (60-80 mg total) by mouth at bedtime as needed. 30 tablet 3   tamsulosin  (FLOMAX ) 0.4 MG CAPS capsule Take 1 capsule (0.4 mg total) by mouth daily after supper. 90 capsule 3   No current facility-administered medications for this visit.    PHYSICAL EXAM: Vitals:   10/12/24 1433 10/12/24 1435  BP: (!) 166/80 (!) 142/82  Pulse: 93   SpO2: 97%   Weight: 210 lb (95.3 kg)   Height: 5' 11 (1.803 m)    Body mass index is 29.29 kg/m.  Wt Readings from Last 3 Encounters:  10/12/24 210 lb (95.3 kg)  06/02/24 203 lb 2 oz (92.1 kg)  12/17/23 205 lb (93 kg)     General: Well developed, well nourished male in no apparent distress.  HEENT: AT/[place], no external lesions. Hearing intact to the spoken word Eyes: EOMI. No stare, proptosis. Conjunctiva clear and no icterus. Neck: Trachea midline, neck supple without appreciable thyromegaly or lymphadenopathy and right palpable thyroid  nodule +, mobile nontender. Lungs: Clear to auscultation, no wheeze. Respirations not labored Heart: S1S2, Regular in rate and rhythm.  Abdomen: Soft, non  tender, non distended, no masses, no striae Neurologic: Alert, oriented, normal speech, deep tendon biceps reflexes normal,  no gross focal neurological deficit Extremities: No pedal pitting edema, no tremors of outstretched hands Skin: Warm, color good.  Psychiatric: Does not appear depressed or anxious  PERTINENT HISTORIC LABORATORY AND IMAGING STUDIES:  All pertinent laboratory results were reviewed. Please see HPI also for further details.   TSH  Date Value Ref Range Status  05/25/2021 2.16 0.35 - 5.50 uIU/mL Final  05/19/2020 3.05 0.35 - 4.50 uIU/mL Final  11/02/2019 3.07 0.35 - 4.50 uIU/mL Final     ASSESSMENT / PLAN  1. Multiple thyroid  nodules    Patient initially found to have right thyroid  nodule on physical exam, ultrasound in July 2025 showed right inferior solid hypoechoic thyroid  nodule measuring 2 cm and right mid predominantly cystic nodule and a small left thyroid  nodule.  He underwent FNA of right inferior thyroid  nodule with cytology AUS and Afirma benign in July 2025.  - No compressive symptoms, euthyroid, no family h/o thyroid  cancer, no h/o radiation exposure.  Plan: - Will continue to monitor thyroid  nodules including right inferior solid thyroid  nodule with serial ultrasound as he has benign Afirma result. - Will plan to check ultrasound thyroid  in about 3 months which will be about 6 months from the first ultrasound completed in July. - Check thyroid  function test.  He is clinically euthyroid today. -Asked patient to call our clinic with any concern regarding neck discomfort in between the visits. - Follow-up with endocrinology in 1 year.   Thyroid  nodule / FNA talk: -Approximately 5% of individuals have palpable thyroid  nodules and 30% or more of adults have non-palpable nodules. The prevalence of nodules increases with age, so that perhaps 50% of individuals older than 66 years of age have nodules. Many of these nodules will be well under 1cm in size.   -The incidence of thyroid  cancer is low, but rising. The prevalence of thyroid  cancer is low compared with the prevalence of thyroid  nodule.  -There have been a number of studies that have estimated risk of malignancy in thyroid  nodule. The estimated risk varies  with size and other characteristics of nodules selected for biopsy, the technique used for the biopsy, the institution and its referral patterns, and the characteristics of the local population. Most studies have estimated malignancy risk in nodules that are palpable or > 1cm on U/S to be in the range of 3-15%.    - In general, options for further evaluation include: - Follow with serial U/S - Fine needle aspiration biopsy - Thyroidectomy - In general the criteria for FNA biopsy includes:  - All palpable nodules - Generally nodules > 1 and for some nodules up to > 1 to 2 cm, based on other criteria.  - Rarely Nodules > 8-103mm in two or more dimensions with high risk sonographic features, or occuring in patients with high risk historical features. Nodules not meeting the above criteria can generally be followed with serial ultrasounds.  -Thyroid  FNA is required for further evaluation of nodule to see if suspicious for cancer which may require further surgical treatment. -There are false positive and false negative results with FNA and need for repeat FNA or surgical resection in some cases and also possibility of missing a diagnosis of Cancer in 5-15% of cases. -discussed the risk and benefits of procedure and potential complications such as bleeding, pain in neck, swelling in neck in rare cases due to hematoma formation and causing respiratory compromise, possibility of infection etc. -discussed the need for continued follow up even if the nodule were found to be benign on FNA.  -discussed that the only near 100% method of ruling out thyroid  cancer is by surgical resection. - Patient already had FNA of right thyroid  nodule.  You may read  about thyroid  nodule from this link of American Thyroid  Association.  https://[email or web address] .org/thyroid -nodules/  We have reviewed our plan outlined above with the patient verbalizes understanding.     Hawkin Charo was seen today for thyroid  nodule.  Diagnoses and all orders for this visit:  Multiple thyroid  nodules -     US  THYROID ; Future -     T4, free -     TSH    DISPOSITION Follow up in clinic in 12 months suggested.  All questions answered and patient verbalized understanding of the plan.  Betina Puckett, MD [facility] Endocrinology [facility] [address] [place], [address] [place], [place] [address] Phone # [phone]  At least part of this note was generated using voice recognition software. Inadvertent word errors may have occurred, which were not recognized during the proofreading process.

## 2024-10-13 ENCOUNTER — Ambulatory Visit: Payer: Self-pay | Admitting: Endocrinology

## 2024-10-13 LAB — TSH: TSH: 2.26 m[IU]/L (ref 0.40–4.50)

## 2024-10-13 LAB — T4, FREE: Free T4: 1.1 ng/dL (ref 0.8–1.8)

## 2024-11-17 ENCOUNTER — Other Ambulatory Visit: Payer: Self-pay | Admitting: Internal Medicine

## 2024-12-15 ENCOUNTER — Ambulatory Visit: Payer: Medicare Other | Admitting: Urology

## 2024-12-15 ENCOUNTER — Encounter: Payer: Self-pay | Admitting: Urology

## 2024-12-15 VITALS — BP 167/93 | HR 93 | Ht 71.0 in | Wt 205.0 lb

## 2024-12-15 DIAGNOSIS — R351 Nocturia: Secondary | ICD-10-CM

## 2024-12-15 DIAGNOSIS — N401 Enlarged prostate with lower urinary tract symptoms: Secondary | ICD-10-CM

## 2024-12-15 DIAGNOSIS — R338 Other retention of urine: Secondary | ICD-10-CM | POA: Diagnosis not present

## 2024-12-15 DIAGNOSIS — R3912 Poor urinary stream: Secondary | ICD-10-CM | POA: Diagnosis not present

## 2024-12-15 DIAGNOSIS — N138 Other obstructive and reflux uropathy: Secondary | ICD-10-CM

## 2024-12-15 DIAGNOSIS — R350 Frequency of micturition: Secondary | ICD-10-CM

## 2024-12-15 DIAGNOSIS — R39198 Other difficulties with micturition: Secondary | ICD-10-CM | POA: Diagnosis not present

## 2024-12-15 LAB — URINALYSIS, ROUTINE W REFLEX MICROSCOPIC
Bilirubin, UA: NEGATIVE
Glucose, UA: NEGATIVE
Ketones, UA: NEGATIVE
Leukocytes,UA: NEGATIVE
Nitrite, UA: NEGATIVE
Protein,UA: NEGATIVE
RBC, UA: NEGATIVE
Urobilinogen, Ur: 0.2 mg/dL (ref 0.2–1.0)
pH, UA: 6 (ref 5.0–7.5)

## 2024-12-15 NOTE — Progress Notes (Signed)
 "  Assessment: 1. BPH with obstruction/lower urinary tract symptoms   2. Nocturia     Plan: He would like to continue medical therapy for his BPH with LUTS.  His urinary symptoms are stable on tamsulosin . Continue tamsulosin  0.4 mg daily PSA with annual exam in July. Return to office in 1 year  Chief Complaint:  Chief Complaint  Patient presents with   Benign Prostatic Hypertrophy    History of Present Illness:  Jacob BATTERSHELL is a 69 y.o. male who is seen for continued evaluation of abnormal DRE and lower urinary tract symptoms.  He noted worsening of his urinary symptoms in October 2023.  He was having increased nocturia 5-6 times per night and some dysuria.  He also noted some intermittency and decreased stream.  He was noted to have some asymmetry on prostate exam at that time.  Urinalysis was unremarkable.  Urine culture showed no growth. He was started on tamsulosin  0.4 mg daily with improvement in his urinary symptoms with decreased nocturia to 1-2 times per night.   IPSS = 14. PVR = 274 ml DRE showed asymmetry with R>L.  PSA results: 6/20 1.66 7/22 2.41 7/23 2.59 10/23 3.44 1/24 2.1 7/24 2.3 7/25 2.37  No history of elevated PSA.  No history of UTI or prostatitis.  No prior prostate biopsy.  He has a history of a kidney stone, passing the stone spontaneously approximately 3 years ago.  No recent stone symptoms.  He has a history of erectile dysfunction and uses sildenafil  as needed with good results.  At his visit in 1/24, he continued on tamsulosin  with improvement in his urinary symptoms with an improved urinary flow and decreased nocturia.  He continued with nocturia x 2.  No side effects from the medication.  No dysuria or gross hematuria. IPSS = 10. PVR = 146 ml.  At his visit in 4/24, he continued on tamsulosin  0.4 mg daily.  His urinary symptoms were fairly stable.  He continued to be bothered by nocturia 1-3 times per night.  No dysuria or gross  hematuria. IPSS = 19.  PVR = 21 ml. He was given a trial of Myrbetriq  25 mg daily.  At his visit in July 2024, he continued on tamsulosin  0.4 mg daily.  His urinary symptoms were slighlty improved.  He continued with some decreased force of stream and intermittent stream.  He had nocturia 1-2 times per night.  He did not appreciate any significant change in his urinary symptoms with Myrbetriq .  No dysuria or gross hematuria. IPSS = 11.  At his visit in January 2025, he continued on tamsulosin  0.4 mg daily.  His urinary symptoms remained fairly stable.  He continued with some frequency, nocturia x 1-2, and occasional intermittent stream.  No dysuria or gross hematuria.  He was  overall satisfied with his current voiding symptoms. IPSS = 10/2.  He returns today for follow-up.  He continues on tamsulosin  0.4 mg daily.  He continues with urinary symptoms including frequency, weak stream, intermittent stream, and sensation of incomplete emptying.  No dysuria or gross hematuria. IPSS = 13/2. He is taking sildenafil  60-80 mg as needed for erectile dysfunction.  Portions of the above documentation were copied from a prior visit for review purposes only.   Past Medical History:  Past Medical History:  Diagnosis Date   Allergic rhinitis    Allergy    GERD (gastroesophageal reflux disease)    Hypertension    Low back pain  intermittent    Past Surgical History:  Past Surgical History:  Procedure Laterality Date   COLONOSCOPY  09/07/2018   Dr.Stark   toe injury  2015   R great toe, saw ortho    Allergies:  No Known Allergies  Family History:  Family History  Problem Relation Age of Onset   Hypertension Mother        also GF   Pneumonia Father    Diabetes Neg Hx    Colon cancer Neg Hx    Prostate cancer Neg Hx    Heart attack Neg Hx    Colon polyps Neg Hx    Rectal cancer Neg Hx    Stomach cancer Neg Hx    Esophageal cancer Neg Hx     Social History:  Social History    Tobacco Use   Smoking status: Former   Smokeless tobacco: Former   Tobacco comments:    quit in the 90s  Substance Use Topics   Alcohol use: Yes    Alcohol/week: 1.0 standard drink of alcohol    Types: 1 Glasses of wine per week    Comment: wine at night    Drug use: No    ROS: Constitutional:  Negative for fever, chills, weight loss CV: Negative for chest pain, previous MI, hypertension Respiratory:  Negative for shortness of breath, wheezing, sleep apnea, frequent cough GI:  Negative for nausea, vomiting, bloody stool, GERD   Physical exam: BP (!) 167/93   Pulse 93   Ht 5' 11 (1.803 m)   Wt 205 lb (93 kg)   BMI 28.59 kg/m  GENERAL APPEARANCE:  Well appearing, well developed, well nourished, NAD HEENT:  Atraumatic, normocephalic, oropharynx clear NECK:  Supple without lymphadenopathy or thyromegaly ABDOMEN:  Soft, non-tender, no masses EXTREMITIES:  Moves all extremities well, without clubbing, cyanosis, or edema NEUROLOGIC:  Alert and oriented x 3, normal gait, CN II-XII grossly intact MENTAL STATUS:  appropriate BACK:  Non-tender to palpation, No CVAT SKIN:  Warm, dry, and intact GU: Prostate: 40 g, NT, slight asymmetry R>L Rectum: Normal tone,  no masses or tenderness    Results: U/A: Negative "

## 2024-12-30 ENCOUNTER — Ambulatory Visit
Admission: RE | Admit: 2024-12-30 | Discharge: 2024-12-30 | Disposition: A | Source: Ambulatory Visit | Attending: Endocrinology

## 2024-12-30 DIAGNOSIS — E042 Nontoxic multinodular goiter: Secondary | ICD-10-CM

## 2025-01-05 ENCOUNTER — Ambulatory Visit: Payer: Medicare Other

## 2025-06-07 ENCOUNTER — Encounter: Admitting: Internal Medicine

## 2025-10-12 ENCOUNTER — Ambulatory Visit: Admitting: Endocrinology
